# Patient Record
Sex: Male | Born: 1940 | Race: White | Hispanic: No | Marital: Married | Smoking: Never smoker
Health system: Southern US, Community
[De-identification: ages and names within clinical notes are randomized; demographics above are authoritative.]

## PROBLEM LIST (undated history)

## (undated) DIAGNOSIS — E785 Hyperlipidemia, unspecified: Secondary | ICD-10-CM

## (undated) DIAGNOSIS — J189 Pneumonia, unspecified organism: Secondary | ICD-10-CM

## (undated) DIAGNOSIS — R42 Dizziness and giddiness: Secondary | ICD-10-CM

## (undated) DIAGNOSIS — N183 Chronic kidney disease, stage 3 unspecified: Secondary | ICD-10-CM

## (undated) DIAGNOSIS — N400 Enlarged prostate without lower urinary tract symptoms: Secondary | ICD-10-CM

## (undated) DIAGNOSIS — N189 Chronic kidney disease, unspecified: Secondary | ICD-10-CM

## (undated) HISTORY — PX: CATARACT EXTRACTION: SUR2

## (undated) HISTORY — PX: INNER EAR SURGERY: SHX679

## (undated) HISTORY — DX: Benign prostatic hyperplasia without lower urinary tract symptoms: N40.0

## (undated) HISTORY — DX: Hyperlipidemia, unspecified: E78.5

## (undated) HISTORY — PX: KIDNEY STONE SURGERY: SHX686

## (undated) HISTORY — DX: Chronic kidney disease, unspecified: N18.9

## (undated) HISTORY — DX: Chronic kidney disease, stage 3 unspecified: N18.30

## (undated) HISTORY — PX: CLEFT PALATE REPAIR: SUR1165

## (undated) HISTORY — DX: Dizziness and giddiness: R42

## (undated) HISTORY — DX: Pneumonia, unspecified organism: J18.9

---

## 1997-09-07 ENCOUNTER — Ambulatory Visit (HOSPITAL_BASED_OUTPATIENT_CLINIC_OR_DEPARTMENT_OTHER): Admission: RE | Admit: 1997-09-07 | Discharge: 1997-09-07 | Payer: Self-pay | Admitting: *Deleted

## 1997-12-19 ENCOUNTER — Ambulatory Visit (HOSPITAL_COMMUNITY): Admission: RE | Admit: 1997-12-19 | Discharge: 1997-12-19 | Payer: Self-pay | Admitting: Family Medicine

## 2005-07-11 ENCOUNTER — Ambulatory Visit: Payer: Self-pay

## 2006-10-12 ENCOUNTER — Ambulatory Visit: Payer: Self-pay | Admitting: Gastroenterology

## 2006-10-26 ENCOUNTER — Ambulatory Visit: Payer: Self-pay | Admitting: Gastroenterology

## 2009-06-29 ENCOUNTER — Emergency Department (HOSPITAL_COMMUNITY): Admission: EM | Admit: 2009-06-29 | Discharge: 2009-06-30 | Payer: Self-pay | Admitting: Emergency Medicine

## 2009-07-03 ENCOUNTER — Emergency Department (HOSPITAL_COMMUNITY): Admission: EM | Admit: 2009-07-03 | Discharge: 2009-07-04 | Payer: Self-pay | Admitting: Emergency Medicine

## 2009-07-09 ENCOUNTER — Ambulatory Visit (HOSPITAL_COMMUNITY): Admission: RE | Admit: 2009-07-09 | Discharge: 2009-07-09 | Payer: Self-pay | Admitting: Urology

## 2010-03-31 LAB — URINALYSIS, ROUTINE W REFLEX MICROSCOPIC
Bilirubin Urine: NEGATIVE
Glucose, UA: NEGATIVE mg/dL
Glucose, UA: NEGATIVE mg/dL
Ketones, ur: NEGATIVE mg/dL
Specific Gravity, Urine: 1.019 (ref 1.005–1.030)
Specific Gravity, Urine: 1.022 (ref 1.005–1.030)
Urobilinogen, UA: 0.2 mg/dL (ref 0.0–1.0)
pH: 6.5 (ref 5.0–8.0)
pH: 7 (ref 5.0–8.0)

## 2010-03-31 LAB — POCT I-STAT, CHEM 8
BUN: 18 mg/dL (ref 6–23)
Calcium, Ion: 1.14 mmol/L (ref 1.12–1.32)
Calcium, Ion: 1.24 mmol/L (ref 1.12–1.32)
Chloride: 105 mEq/L (ref 96–112)
Creatinine, Ser: 1.4 mg/dL (ref 0.4–1.5)
Glucose, Bld: 104 mg/dL — ABNORMAL HIGH (ref 70–99)
Glucose, Bld: 138 mg/dL — ABNORMAL HIGH (ref 70–99)
HCT: 44 % (ref 39.0–52.0)
HCT: 46 % (ref 39.0–52.0)
Hemoglobin: 15.6 g/dL (ref 13.0–17.0)
Sodium: 138 mEq/L (ref 135–145)
TCO2: 25 mmol/L (ref 0–100)

## 2011-05-07 ENCOUNTER — Ambulatory Visit (INDEPENDENT_AMBULATORY_CARE_PROVIDER_SITE_OTHER): Payer: Medicare Other | Admitting: *Deleted

## 2011-05-07 DIAGNOSIS — I7300 Raynaud's syndrome without gangrene: Secondary | ICD-10-CM

## 2011-05-07 DIAGNOSIS — R230 Cyanosis: Secondary | ICD-10-CM

## 2016-05-26 ENCOUNTER — Other Ambulatory Visit: Payer: Self-pay | Admitting: Urology

## 2016-05-26 DIAGNOSIS — Q6239 Other obstructive defects of renal pelvis and ureter: Secondary | ICD-10-CM

## 2016-06-04 ENCOUNTER — Encounter (HOSPITAL_COMMUNITY)
Admission: RE | Admit: 2016-06-04 | Discharge: 2016-06-04 | Disposition: A | Discharge status: Home / Self Care | Payer: Medicare Other | Source: Ambulatory Visit | Attending: Urology | Admitting: Urology

## 2016-06-04 DIAGNOSIS — Q6239 Other obstructive defects of renal pelvis and ureter: Secondary | ICD-10-CM | POA: Diagnosis not present

## 2016-06-04 MED ORDER — FUROSEMIDE 10 MG/ML IJ SOLN
INTRAMUSCULAR | Status: AC
  Filled 2016-06-04: qty 4

## 2016-06-04 MED ORDER — TECHNETIUM TC 99M MERTIATIDE: 5.0000 | Freq: Once | INTRAVENOUS | Status: DC | PRN

## 2016-06-04 MED ORDER — FUROSEMIDE 10 MG/ML IJ SOLN
41.0000 mg | Freq: Once | INTRAMUSCULAR | Status: AC
  Administered 2016-06-04: 41 mg via INTRAVENOUS

## 2016-11-17 ENCOUNTER — Encounter: Payer: Self-pay | Admitting: Gastroenterology

## 2017-01-12 ENCOUNTER — Telehealth: Payer: Self-pay | Admitting: Gastroenterology

## 2017-01-12 NOTE — Telephone Encounter (Signed)
Left message on machine to call back  

## 2017-01-14 NOTE — Telephone Encounter (Signed)
Dr Alinda DoomsJacobs FYI, please advise on timing of next colon.

## 2017-01-18 NOTE — Telephone Encounter (Signed)
Ok, please let him know that he does not need colon cancer screening of any type for 3 years.  At that point, he should discuss screening options with his PCP or set up appt with me to discuss. He will be 79 at that point and often colon cancer screening is no longer recommended at that age.

## 2017-01-19 NOTE — Telephone Encounter (Signed)
The pt was advised per Dr Christella HartiganJacobs recommendations.  He will follow up as needed or in 3 years.

## 2017-12-07 IMAGING — NM NM RENAL IMAGING FLOW W/ PHARM
2 series · 12 of 12 positions shown · non-contrast
Comparison: None; correlation CT abdomen and pelvis 06/30/2009

CLINICAL DATA: Kidney stones, question obstruction

EXAM:
NUCLEAR MEDICINE RENAL SCAN WITH DIURETIC ADMINISTRATION
TECHNIQUE: Radionuclide angiographic and sequential renal images were obtained
after intravenous injection of radiopharmaceutical. Imaging was
continued during slow intravenous injection of Lasix approximately
15 minutes after the start of the examination.
RADIOPHARMACEUTICALS:  5.0 mCi 7echnetium-DDm MAG3 IV
Pharmaceutical: Lasix 40 mg IV 20 minutes into the exam

[Series 1: renal scan · 4.14mm/px · 6 of 40 frames shown (1 of 2)]
[frame 4/40  full-range]
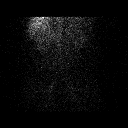
[frame 10/40  full-range]
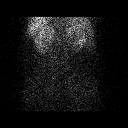
[frame 17/40  full-range]
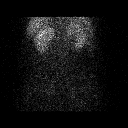
[frame 24/40  full-range]
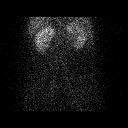
[frame 30/40  full-range]
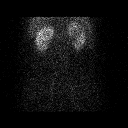
[frame 37/40  full-range]
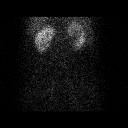

[Series 1: renal scan · 4.14mm/px · 6 of 85 frames shown (2 of 2)]
[frame 8/85]
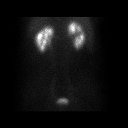
[frame 22/85]
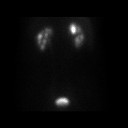
[frame 36/85]
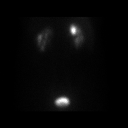
[frame 50/85]
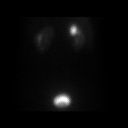
[frame 64/85]
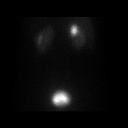
[frame 78/85]
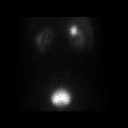

[12 of 12 positions shown; findings below may reference images not displayed]

FINDINGS: Flow:  Prompt symmetric flow to both kidneys.

Left renogram: Normal uptake, concentration and excretion of tracer
by LEFT kidney. Normal time to peak activity of 5.2 minutes with
fall to half maximum activity 16.9 minutes later. No significant
retention of tracer at the conclusion of the exam.

Right renogram: Normal uptake, concentration and excretion of
tracer. Mildly dilated collecting system is seen at the upper pole
of the RIGHT kidney. Retention of tracer within this dilated
collecting system likely accounts for delayed time to peak activity
of 18.2 minutes. Following diuretic administration, normal washout
of tracer from the RIGHT kidney is identified, including from the
dilated collecting system at the upper pole, falling to half maximum
activity 16.9 minutes later.

Differential:

Left kidney = 57 %

Right kidney = 43 %

T1/2 post Lasix :

Left kidney = 16.9 min

Right kidney = 16.9 min
IMPRESSION: Focal mild dilatation of the collecting system of the upper pole of
the RIGHT kidney.

However, normal washout of tracer is seen from both kidneys
following diuretic administration without evidence of urinary tract
obstruction.

Otherwise normal exam.

## 2018-05-03 ENCOUNTER — Other Ambulatory Visit: Payer: Self-pay | Admitting: Internal Medicine

## 2018-05-03 DIAGNOSIS — R7989 Other specified abnormal findings of blood chemistry: Secondary | ICD-10-CM

## 2018-05-06 ENCOUNTER — Ambulatory Visit
Admission: RE | Admit: 2018-05-06 | Discharge: 2018-05-06 | Disposition: A | Discharge status: Home / Self Care | Payer: Medicare Other | Source: Ambulatory Visit | Attending: Internal Medicine | Admitting: Internal Medicine

## 2018-05-06 ENCOUNTER — Other Ambulatory Visit: Payer: Self-pay

## 2018-05-06 DIAGNOSIS — R7989 Other specified abnormal findings of blood chemistry: Secondary | ICD-10-CM

## 2019-11-08 IMAGING — US US RENAL
2 series · 13 of 25 positions shown · non-contrast
Comparison: CT 06/30/2009

CLINICAL DATA: Elevated creatinine

EXAM:
RENAL / URINARY TRACT ULTRASOUND COMPLETE

[Series 1: us renal · 0.25mm/px · 59 acquisitions, 12 frames shown (1 of 2)]
[im 1/59]
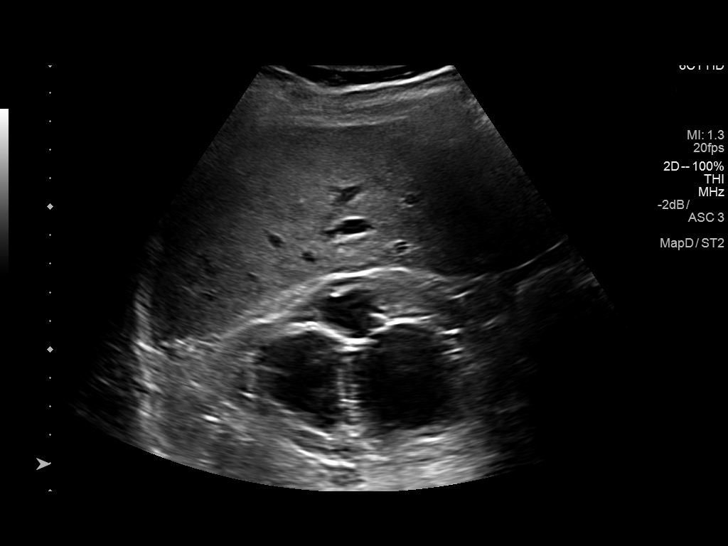
[im 6/59]
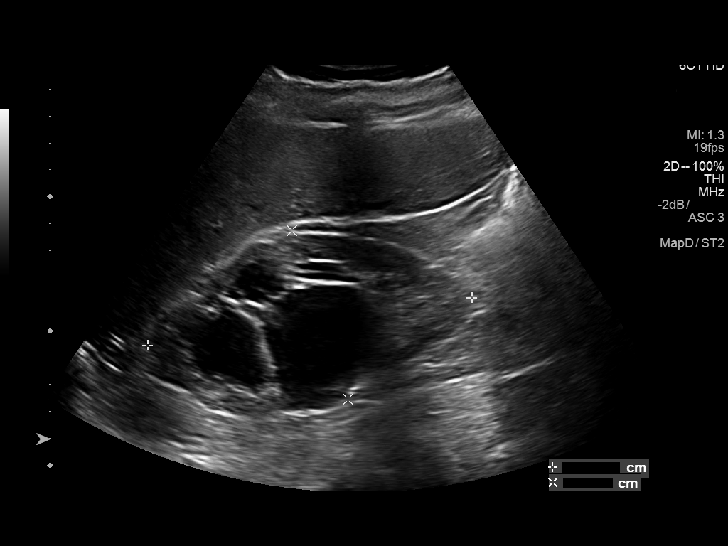
[im 11/59]
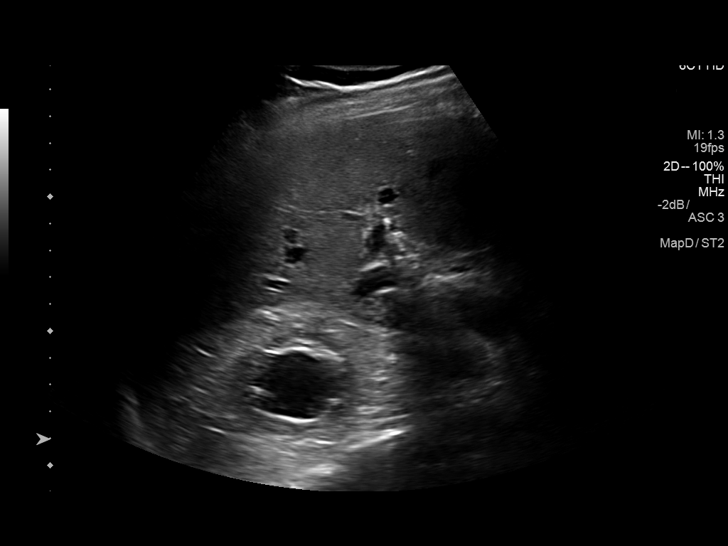
[im 16/59]
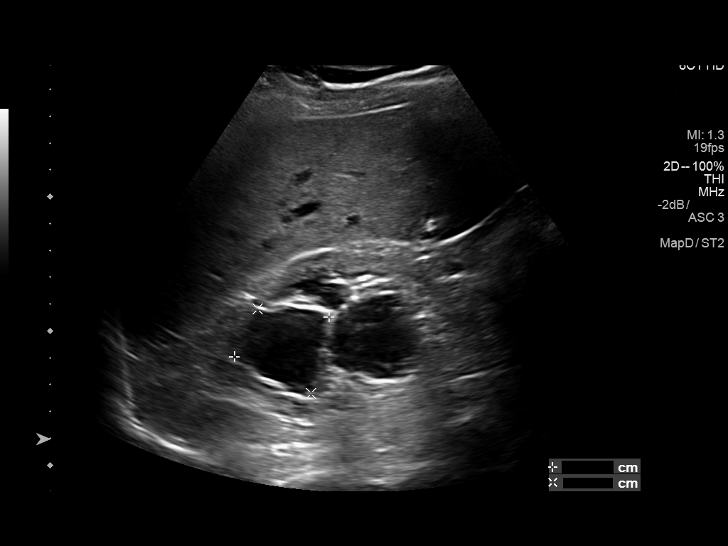
[im 22/59]
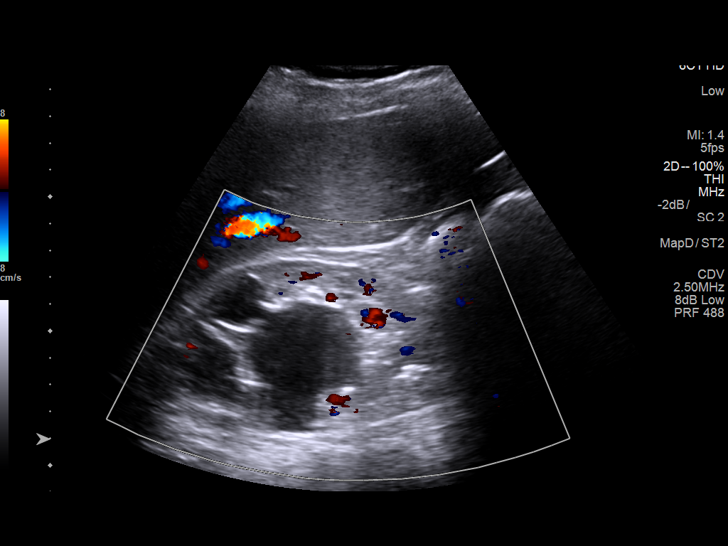
[im 27/59]
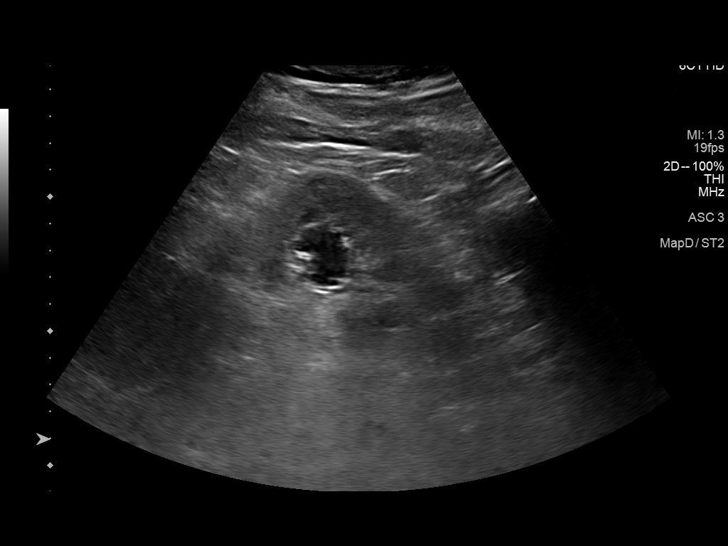
[im 32/59]
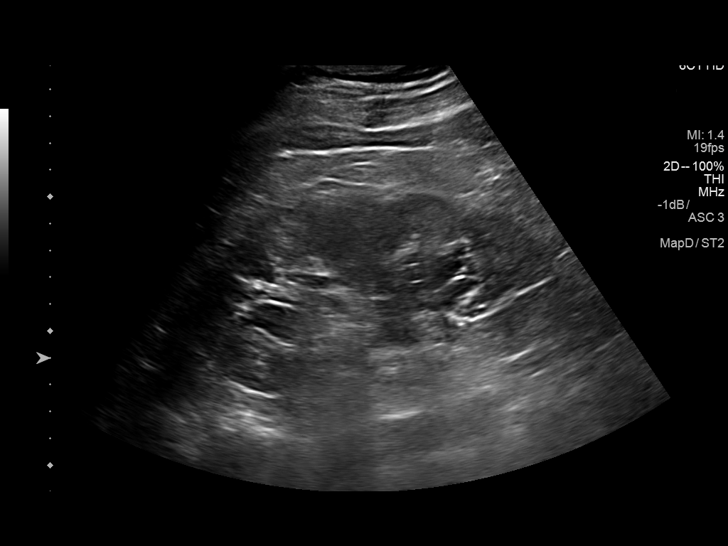
[im 37/59]
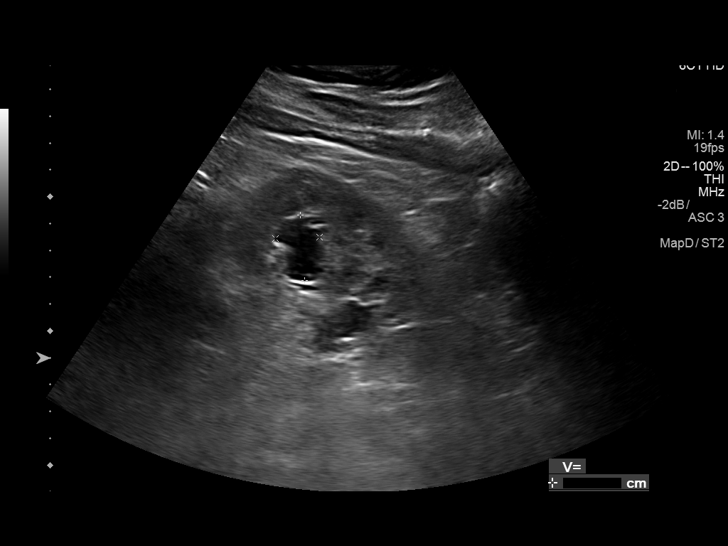
[im 43/59]
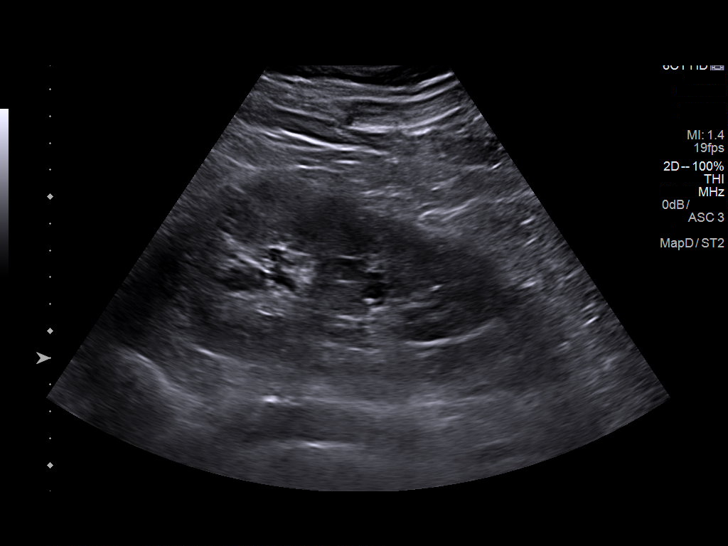
[im 48/59]
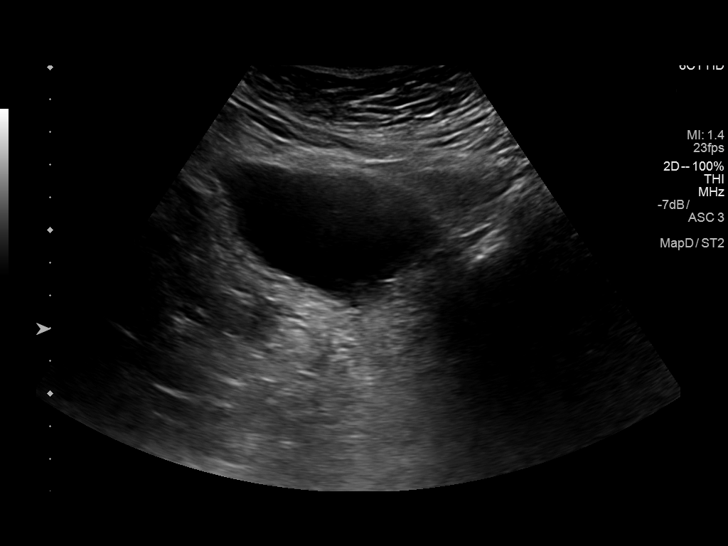
[im 53/59]
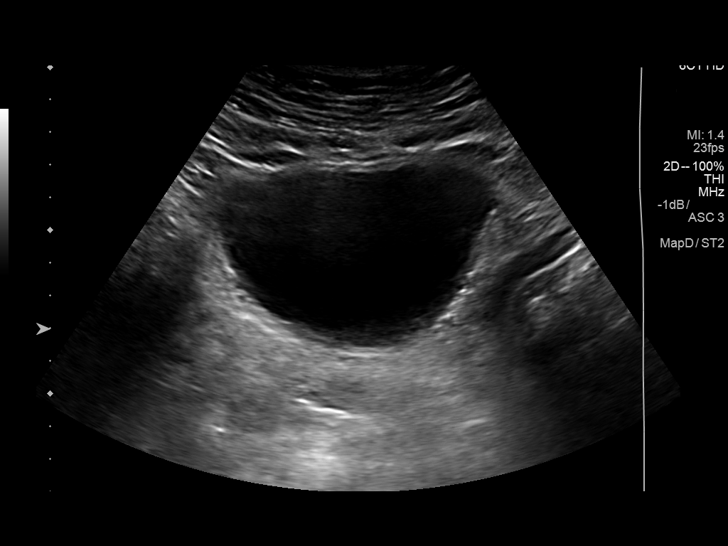
[im 59/59]
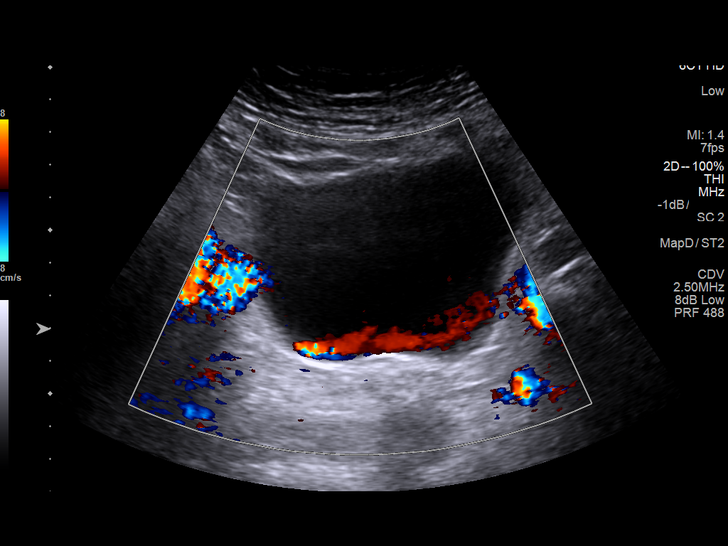

[Series 2: us renal · 0.26mm/px · 1 of 4 slices shown (2 of 2)]
[im 4/4]
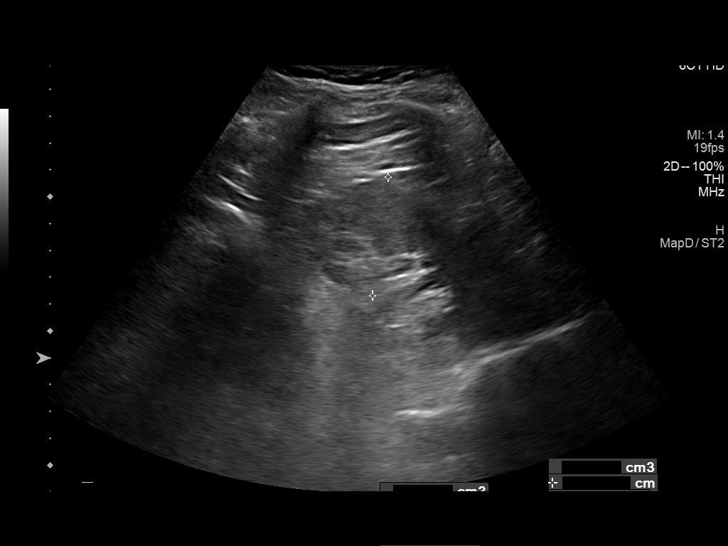

[13 of 25 positions shown; findings below may reference images not displayed]

FINDINGS: Right Kidney:

Renal measurements: 12.2 x 6.6 x 6.4 cm = volume: 271.7 mL. Cortical
echogenicity within normal limits. Multiple, less than 10 central
cystic areas measuring up to 4.8 cm at the midpole.

Left Kidney:

Renal measurements: 12.6 x 6.6 x 4.5 cm = volume: 192.7 mL. Cortical
echogenicity within normal limits. Multiple, less than 10 central
cystic areas with largest area seen at the midpole measuring 2.4 cm.
There may be slight lower pole caliceal dilatation.

Bladder:

Appears normal for degree of bladder distention. Enlarged prostate
gland with mass effect on the posterior bladder.
IMPRESSION: 1. Multiple bilateral right greater than left central cystic areas,
fewer than 10 within each kidney, at least some of which are felt to
represent parapelvic cysts. Anechoic area centrally within the right
kidney either represents central sinus cyst versus dilatation of
right renal pelvis. There may be mild lower pole calyceal dilatation
on the left.
2. Enlarged prostate gland

## 2020-01-10 ENCOUNTER — Other Ambulatory Visit: Payer: Self-pay | Admitting: Urology

## 2020-02-09 ENCOUNTER — Other Ambulatory Visit: Payer: Self-pay | Admitting: Urology

## 2020-02-16 ENCOUNTER — Ambulatory Visit (HOSPITAL_COMMUNITY)
Admission: RE | Admit: 2020-02-16 | Discharge: 2020-02-16 | Disposition: A | Discharge status: Home / Self Care | Payer: Medicare Other | Source: Ambulatory Visit | Attending: Internal Medicine | Admitting: Internal Medicine

## 2020-02-16 ENCOUNTER — Other Ambulatory Visit (HOSPITAL_COMMUNITY): Payer: Self-pay | Admitting: Internal Medicine

## 2020-02-16 ENCOUNTER — Other Ambulatory Visit: Payer: Self-pay

## 2020-02-16 DIAGNOSIS — R42 Dizziness and giddiness: Secondary | ICD-10-CM

## 2020-02-17 ENCOUNTER — Other Ambulatory Visit (HOSPITAL_COMMUNITY): Payer: Self-pay | Admitting: Internal Medicine

## 2020-02-17 DIAGNOSIS — J849 Interstitial pulmonary disease, unspecified: Secondary | ICD-10-CM

## 2020-02-17 DIAGNOSIS — R42 Dizziness and giddiness: Secondary | ICD-10-CM

## 2020-03-12 ENCOUNTER — Ambulatory Visit (HOSPITAL_COMMUNITY): Payer: Medicare Other | Attending: Cardiology

## 2020-03-12 ENCOUNTER — Other Ambulatory Visit: Payer: Self-pay

## 2020-03-12 DIAGNOSIS — J849 Interstitial pulmonary disease, unspecified: Secondary | ICD-10-CM | POA: Diagnosis not present

## 2020-03-12 DIAGNOSIS — E785 Hyperlipidemia, unspecified: Secondary | ICD-10-CM | POA: Diagnosis not present

## 2020-03-12 DIAGNOSIS — I348 Other nonrheumatic mitral valve disorders: Secondary | ICD-10-CM

## 2020-03-12 DIAGNOSIS — I358 Other nonrheumatic aortic valve disorders: Secondary | ICD-10-CM

## 2020-03-12 DIAGNOSIS — I351 Nonrheumatic aortic (valve) insufficiency: Secondary | ICD-10-CM | POA: Diagnosis not present

## 2020-03-12 DIAGNOSIS — R42 Dizziness and giddiness: Secondary | ICD-10-CM | POA: Diagnosis present

## 2020-03-12 DIAGNOSIS — N183 Chronic kidney disease, stage 3 unspecified: Secondary | ICD-10-CM | POA: Insufficient documentation

## 2020-03-12 LAB — ECHOCARDIOGRAM COMPLETE
Area-P 1/2: 2.91 cm2
P 1/2 time: 564 msec
S' Lateral: 2.9 cm

## 2021-01-10 ENCOUNTER — Other Ambulatory Visit: Payer: Self-pay | Admitting: Urology

## 2021-01-15 ENCOUNTER — Other Ambulatory Visit: Payer: Self-pay | Admitting: Urology

## 2021-03-13 ENCOUNTER — Other Ambulatory Visit: Payer: Self-pay | Admitting: Urology

## 2021-05-17 ENCOUNTER — Other Ambulatory Visit: Payer: Self-pay | Admitting: Nephrology

## 2021-05-17 DIAGNOSIS — N400 Enlarged prostate without lower urinary tract symptoms: Secondary | ICD-10-CM

## 2021-05-17 DIAGNOSIS — N1831 Chronic kidney disease, stage 3a: Secondary | ICD-10-CM

## 2021-05-17 DIAGNOSIS — Q614 Renal dysplasia: Secondary | ICD-10-CM

## 2021-05-22 ENCOUNTER — Ambulatory Visit
Admission: RE | Admit: 2021-05-22 | Discharge: 2021-05-22 | Disposition: A | Discharge status: Home / Self Care | Payer: Medicare Other | Source: Ambulatory Visit | Attending: Nephrology | Admitting: Nephrology

## 2021-05-22 DIAGNOSIS — Q614 Renal dysplasia: Secondary | ICD-10-CM

## 2021-05-22 DIAGNOSIS — N1831 Chronic kidney disease, stage 3a: Secondary | ICD-10-CM

## 2021-05-22 DIAGNOSIS — N400 Enlarged prostate without lower urinary tract symptoms: Secondary | ICD-10-CM

## 2022-02-14 ENCOUNTER — Other Ambulatory Visit: Payer: Self-pay | Admitting: Nephrology

## 2022-02-14 DIAGNOSIS — Q614 Renal dysplasia: Secondary | ICD-10-CM

## 2022-02-14 DIAGNOSIS — N1831 Chronic kidney disease, stage 3a: Secondary | ICD-10-CM

## 2022-02-14 DIAGNOSIS — I951 Orthostatic hypotension: Secondary | ICD-10-CM

## 2022-02-14 DIAGNOSIS — N400 Enlarged prostate without lower urinary tract symptoms: Secondary | ICD-10-CM

## 2022-03-05 ENCOUNTER — Ambulatory Visit
Admission: RE | Admit: 2022-03-05 | Discharge: 2022-03-05 | Disposition: A | Discharge status: Home / Self Care | Payer: Medicare Other | Source: Ambulatory Visit | Attending: Nephrology | Admitting: Nephrology

## 2022-03-05 DIAGNOSIS — I951 Orthostatic hypotension: Secondary | ICD-10-CM

## 2022-03-05 DIAGNOSIS — N400 Enlarged prostate without lower urinary tract symptoms: Secondary | ICD-10-CM

## 2022-03-05 DIAGNOSIS — N1831 Chronic kidney disease, stage 3a: Secondary | ICD-10-CM

## 2022-03-05 DIAGNOSIS — Q614 Renal dysplasia: Secondary | ICD-10-CM

## 2022-11-24 IMAGING — US US RENAL
1 series · 13 of 25 positions shown · non-contrast
Comparison: Multiple prior renal ultrasounds, most recent dated
01/05/2020.

CLINICAL DATA: Chronic kidney disease.  Renal cysts.

EXAM:
RENAL / URINARY TRACT ULTRASOUND COMPLETE

[Series 1: us renal · 0.23mm/px · 13 of 52 slices shown]
[im 1/52]
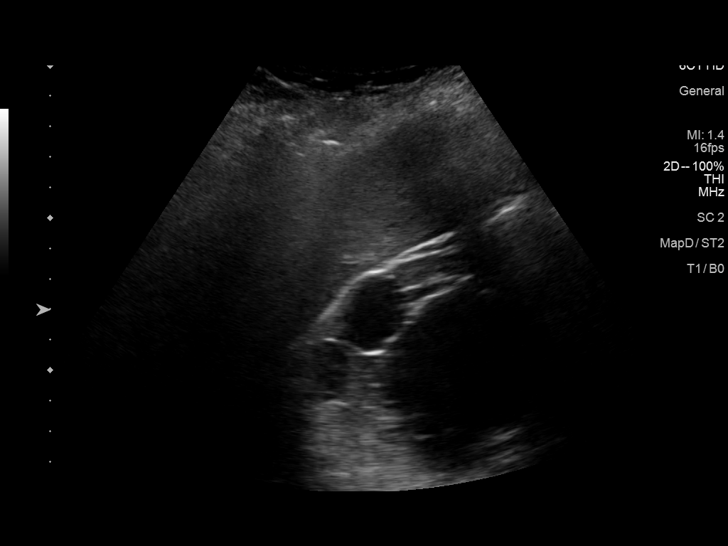
[im 5/52]
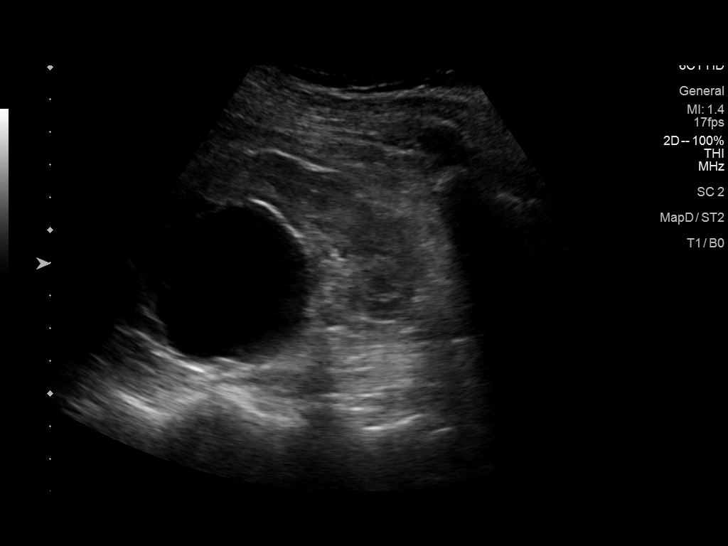
[im 9/52]
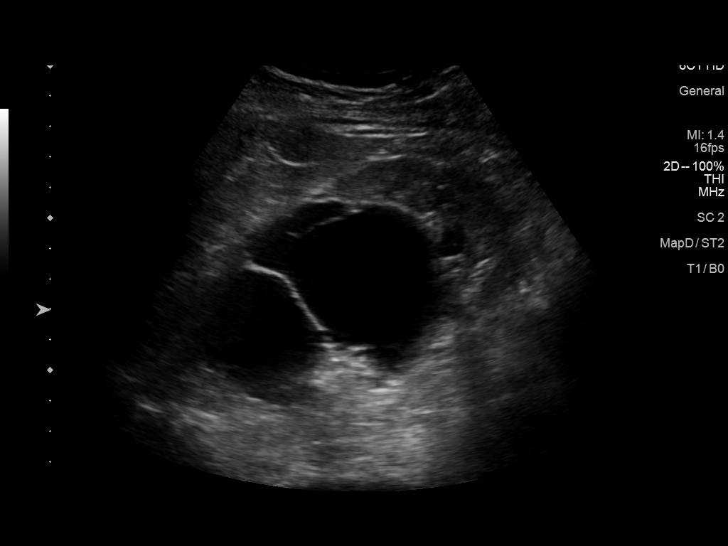
[im 13/52]
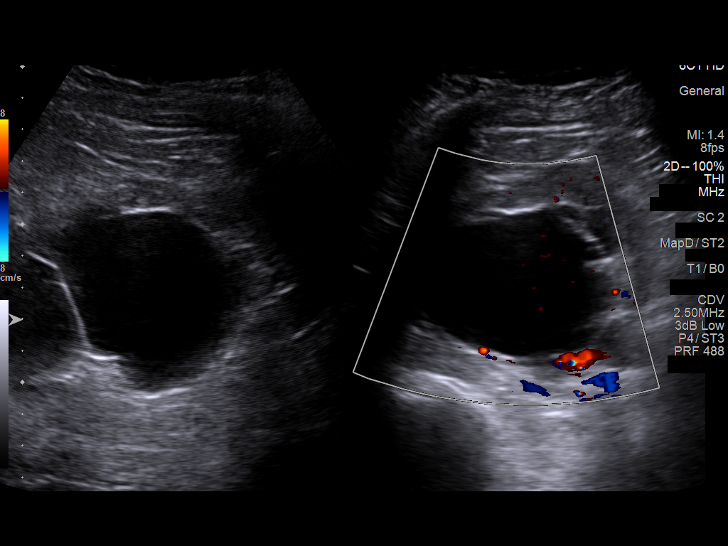
[im 18/52]
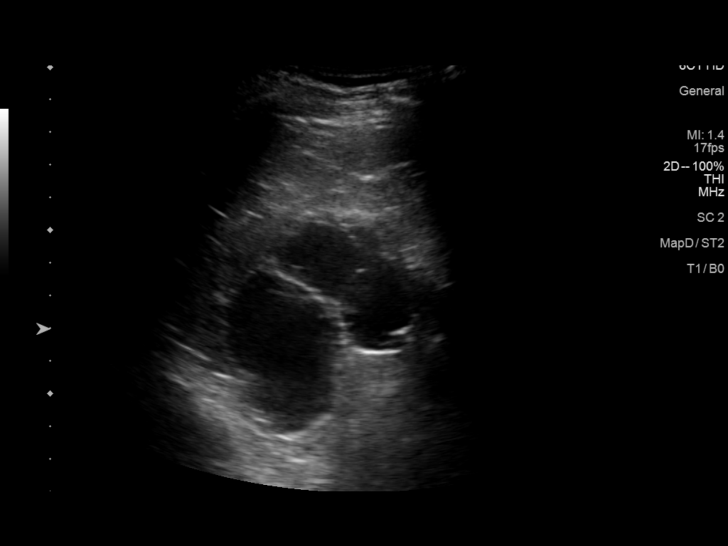
[im 22/52]
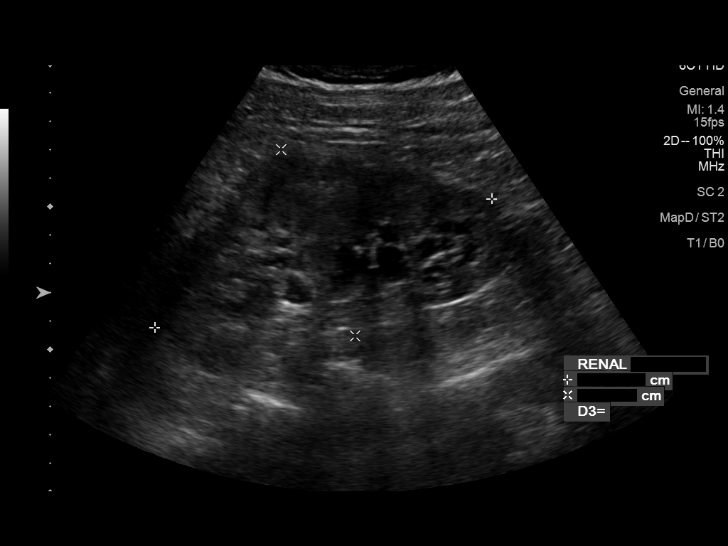
[im 26/52]
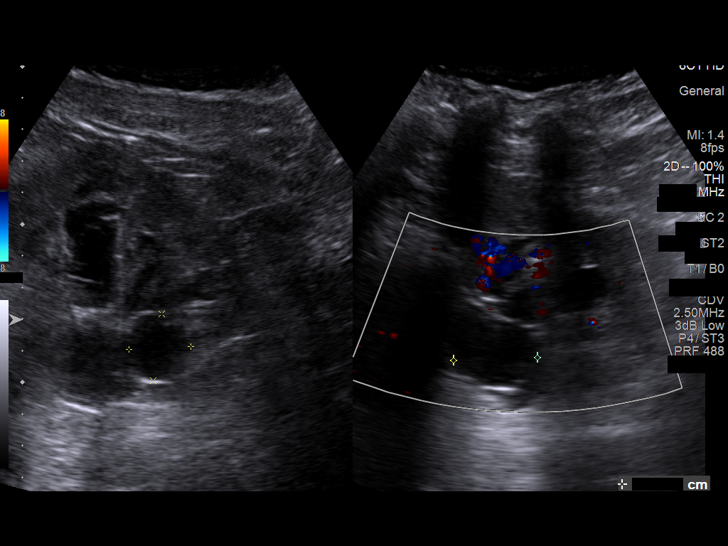
[im 30/52]
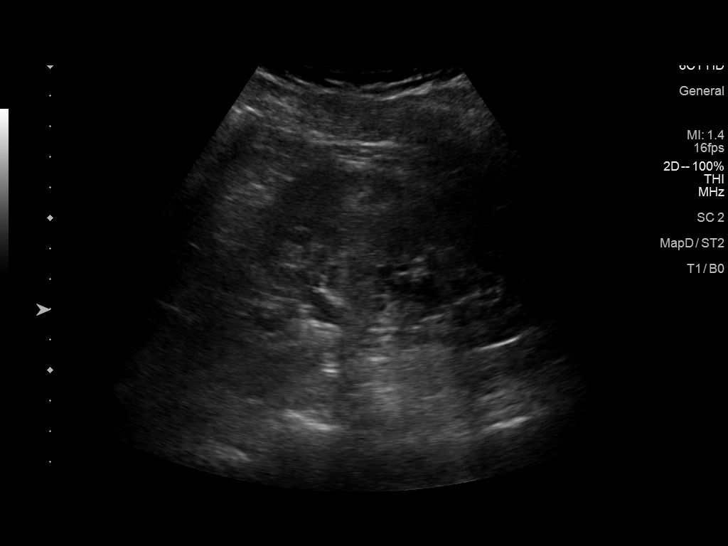
[im 35/52]
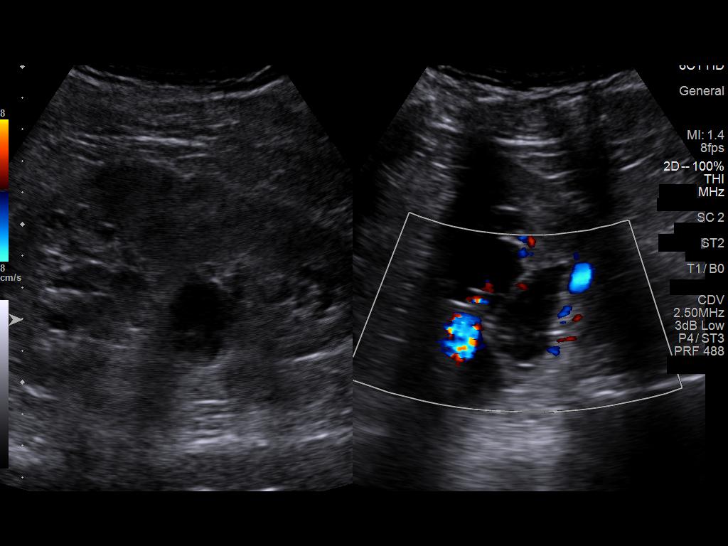
[im 39/52]
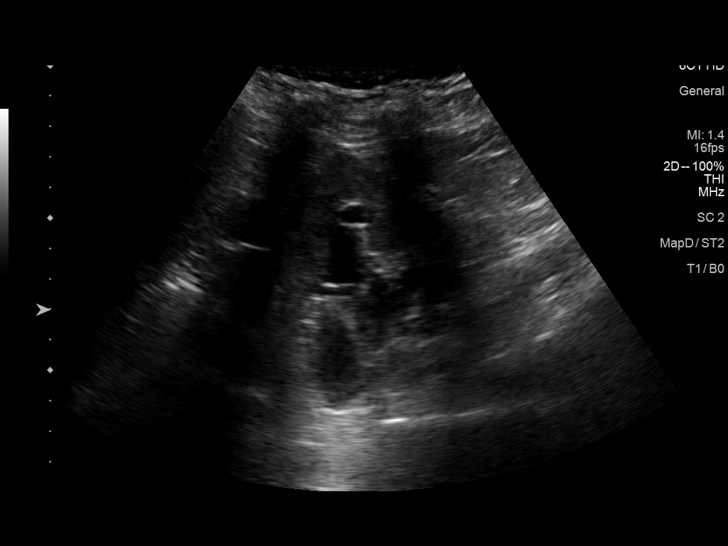
[im 43/52]
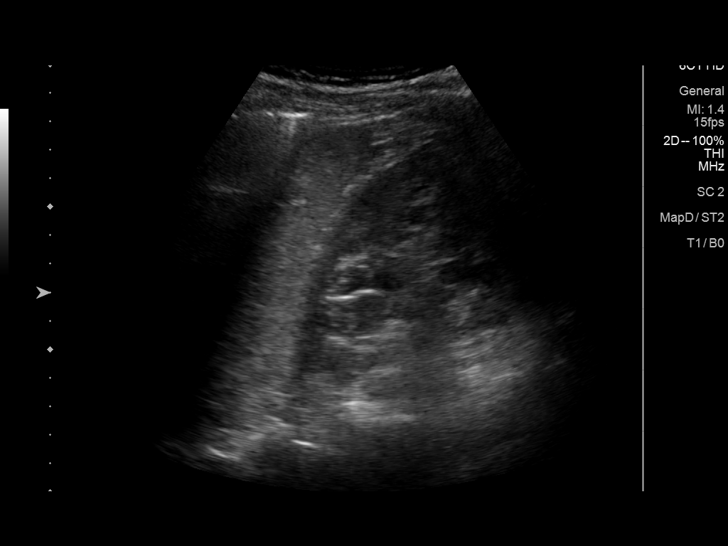
[im 47/52]
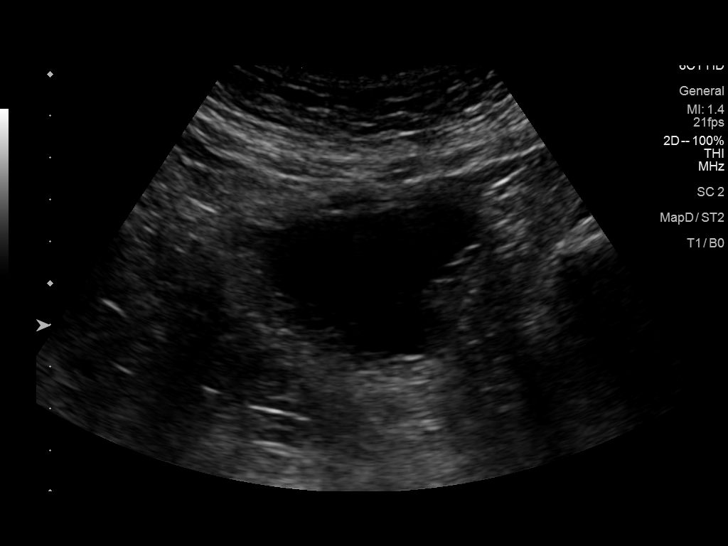
[im 52/52]
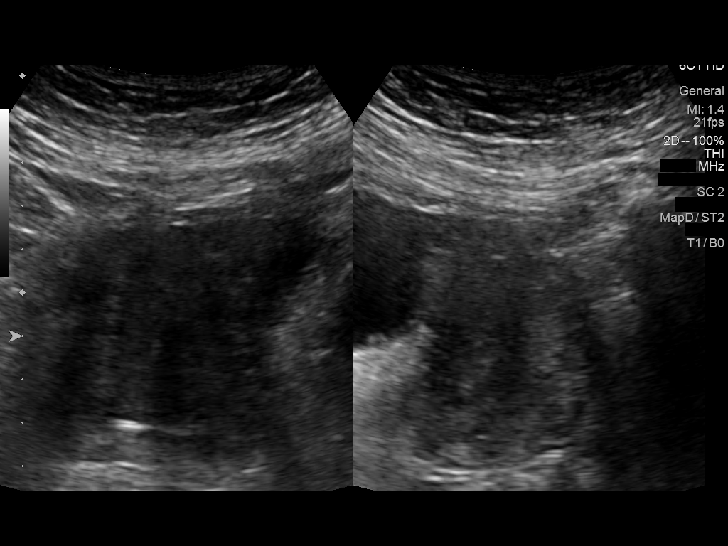

[13 of 25 positions shown; findings below may reference images not displayed]

FINDINGS: Right Kidney:

Renal measurements: 13.7 by 7.0 x 6.7 cm = volume: 346 mL. Normal
parenchymal echogenicity. Renal cortical thinning. Multiple cysts,
largest 3 measured. In the upper pole there is a simple appearing
cyst measuring 5.0 x 3.9 x 4.6 cm. In the medial midpole there is
another prominent cyst measuring 6.5 x 5.6 x 6.0 cm. Lateral to
this, also at the midpole is a smaller cyst measuring 3.1 x 2.8 x
2.5 cm. No convincing solid masses, no stones and no hydronephrosis.

Left Kidney:

Renal measurements: 12.6 x 7.0 x 6.4 cm = volume: 300 mL. Mildly
complicated cyst in the lower pole measuring 2.7 x 2.0 x 2.1 cm.
Similar appearing cyst with a mildly irregular wall and dependent
echogenic foci suggesting debris, and a possible septation, at the
midpole measuring 2.8 x 2.0 x 2.4 cm. There are multiple smaller
cysts. No defined stones and no hydronephrosis.

Bladder:

Appears normal for degree of bladder distention.

Other:

Enlarged prostate, 5.6 x 4.8 x 4.6 cm.
IMPRESSION: 1. Multiple, bilateral renal cysts as detailed above, less than 10,
largest on the right measuring 6.5 cm and largest on the left
measuring 2.7 cm, increased in size from the prior ultrasound. The 2
measured left renal cysts appear mildly complicated, likely Bosniak
category 2, but not fully defined.
2. No acute findings.  No hydronephrosis.
3. Prostate enlargement.

## 2023-07-03 ENCOUNTER — Other Ambulatory Visit: Payer: Self-pay

## 2023-07-03 ENCOUNTER — Ambulatory Visit: Attending: Internal Medicine | Admitting: Speech Pathology

## 2023-07-03 ENCOUNTER — Encounter: Payer: Self-pay | Admitting: Speech Pathology

## 2023-07-03 DIAGNOSIS — R471 Dysarthria and anarthria: Secondary | ICD-10-CM | POA: Insufficient documentation

## 2023-07-03 DIAGNOSIS — R1310 Dysphagia, unspecified: Secondary | ICD-10-CM | POA: Insufficient documentation

## 2023-07-03 NOTE — Therapy (Signed)
 OUTPATIENT SPEECH LANGUAGE PATHOLOGY MOTOR SPEECH EVALUATION   Patient Name: NAHEIM BURGEN MRN: [id number] DOB:1940/06/02, 83 y.o., male Today's Date: 07/03/2023  PCP: Dr. Thor Fling REFERRING PROVIDER: Dr. Thor Fling  END OF SESSION:  End of Session - 07/03/23 1219     Visit Number 1    Number of Visits 9    Date for SLP Re-Evaluation 08/14/23    SLP Start Time 0845    SLP Stop Time  0930    SLP Time Calculation (min) 45 min          History reviewed. No pertinent past medical history. History reviewed. No pertinent surgical history. There are no active problems to display for this patient.   ONSET DATE: 06/30/2023 referral date   REFERRING DIAG: R47.81 (ICD-10-CM) - Slurred speech   THERAPY DIAG:  No diagnosis found.  Rationale for Evaluation and Treatment: Rehabilitation  SUBJECTIVE:   SUBJECTIVE STATEMENT: Patient reports he has noticed a change in his speech for the past 6 to 12 months.  He reports reduced volume and ability to project voice particularly while singing in the car.  He is also noticing challenges with his secretion management characterized by pulling in his throat and drooling.  Also endorsing changes to his swallowing. Pt accompanied by: self  PERTINENT HISTORY: Per 05/11/23 PCP note: Patient presents for work in visit along with his spouse for concerns over progressive worsening tremor in the right upper extremity, especially when he is using the bathroom or holding utensil, writing, but not necessarily at rest. However this is in conjunction with progressive issues of drooling, shuffling gait, holding his elbows up with walking, no falls, but in the setting of chronic dizziness and history of orthostasis as outlined down below.  He denies any fevers, chills, changes to his medications.  Also concerned about his hoarse voice and of course spouses read about Parkinson's disease in online topics.  Given the constellation of symptoms are quite worried and wish and  requested neurology evaluation not for ruling out Parkinson's but also his constellation of symptoms and potential treatments knowing that beta-blockers may actually exacerbate his chronic dizziness.   PAIN:  Are you having pain? No  FALLS: Has patient fallen in last 6 months?  No  LIVING ENVIRONMENT: Lives with: lives with their spouse Lives in: House/apartment  PLOF:  Level of assistance: Independent  Employment: Retired Comptroller   PATIENT GOALS: Improved with speech and swallowing  OBJECTIVE:  Note: Objective measures were completed at Evaluation unless otherwise noted.  COGNITION: Overall cognitive status: Within functional limits for tasks assessed  MOTOR SPEECH: assessed across variety of speech tasks: reading, word repetition, generative discourse sample Overall motor speech: impaired Level of impairment: Word Rate of Speech: Reduced Dysfluencies: none evidenced Phonation: low vocal intensity  Sustained phonation duration: 9 seconds (volume decay)  Conversational loudness average: 68 dB Voice Quality: hoarse, rough, and low vocal intensity  s/z ratio: 1.25 Respiration: thoracic breathing and speaking on residual capacity Word and Phrasal Stress: WFL Resonance: WFL Articulation: Impaired: sentence Diadochokinetic Rate (DDK): irregular rhythm Intelligibility: Intelligible Motor planning: Appears intact Interfering components: anatomical limitations  ORAL MOTOR EXAMINATION: Overall status: WFL Comments: Facial asymmetry, fasciculations  PATIENT REPORTED OUTCOME MEASURES (PROM): Communication Effectiveness Survey        How effective is your speech... Pt Rating  Having a conversation with a family member or friends at home 3  Participating in conversation with strangers in a quiet place  3  Conversing with a familiar person  over the telephone 4  Conversing with a stranger over the telephone 3  Being part of a conversation in a noisy environment  3   Speaking to a friend when you are emotionally upset or angry 4  Having a conversation while traveling in the car 3  Having a conversation with someone at a distance 2                                                                                               Total:  25   1= not at all effective                                                                      4= very effective   SUBJECTIVE DYSPHAGIA REPORTS:  Date of onset: 6-12 months ago Reported symptoms: coughing with liquids and on secretions, globus sensation, weak voice, and challenges with secretion management   Current diet: Dysphagia 3 (mechanical soft) and thin liquids  Co-morbid voice changes: Yes  FACTORS WHICH MAY INCREASE RISK OF ADVERSE EVENT IN PRESENCE OF ASPIRATION:  General health: well appearing  Risk factors: weak cough and GERD or other GI disease    CLINICAL SWALLOW ASSESSMENT:   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 Vocal quality at baseline: hoarse and low vocal intensity Patient directly observed with POs: Yes: thin liquids  Feeding: able to feed self Liquids provided by: cup [facility] Swallow Protocol: Pass Oral phase signs and symptoms: anterior loss of saliva  Pharyngeal phase signs and symptoms: suspected delayed swallow initiation, audible swallow, and complaints of residue, complaints of challenges with swallow                                                                                                                           TREATMENT DATE:  07/03/2023: Education provided on evaluation results and SLP's recommendations. Pt verbalizes agreement with POC, all questions answered to satisfaction. Pt requesting information about how PD can impact speech and swallow. Education provided with supplemental resources given. SLP expressed he will need to follow with neurologist for potential PD dx, this is outside of SLP scope of practice.    PATIENT EDUCATION: Education details:  POC, goals Person educated: Patient Education method: Explanation, Demonstration, and Handouts Education comprehension: verbalized understanding, returned demonstration, and needs further education  HOME EXERCISE PROGRAM:  To be established   GOALS: Goals reviewed with patient? Yes  SHORT TERM GOALS: Target date: 08/14/2023  Pt will demo use of dysarthria strategies and compensations during paragraph level reading tasks with 90% accuracy over 2 sessions Baseline: Goal status: INITIAL  2.  Pt will teach back swallow strategies and compensations with mod-I Baseline:  Goal status: INITIAL  3.  Pt will carryover intentional speech to conversation over 5 minutes, maintaining WNL volume Baseline:  Goal status: INITIAL   LONG TERM GOALS: Target date: 08/28/2023  Pt will complete structured motor speech tasks, meeting target dB in 90% of opportunities and no volume decay Baseline:  Goal status: INITIAL  2.  Pt will maintain WNL volume for 20 minute conversation with rare min-A Baseline:  Goal status: INITIAL  3.  Pt will improve PROM score by 2 pts by d/c Baseline:  Goal status: INITIAL  4.  Pt will report subjective improvement with saliva management with use of trained strategies and compensations  Baseline:  Goal status: INITIAL  ASSESSMENT:  CLINICAL IMPRESSION: Patient is an 83 year old male who is seen today for speech therapy evaluation due to speech and swallow complaints.  Patient presents with a mild dysarthria.  His motor speech is characterized by mildly reduced vocal intensity and occasional imprecise articulation.  He has intact resonance, despite presence of palatal prosthesis.  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  When asked to rate his speech patient states today he feels like he is at 80% of his baseline speech.  He tells SLP that on his worst days his speech is about 50% of his baseline.  His speech is variable though he cannot attribute any pattern to  this variability.  Patient also demonstrating challenges with secretion management.  Patient reports coughing with liquids and on secretions and globus sensation as primary dysphagia complaints.  Patient was able to consume sequential sips of thin liquids without immediate overt signs or symptoms of aspiration.  With increased thin liquids patient did complain of sense of residue and difficulty with swallowing.  SLP provided education on the use of intent for speech and swallowing.  Will plan to address dysphagia via symptom management and if no improvements noted may consider instrumental swallow study to objectively evaluate patient's swallow function.  Will plan to address dysarthria via direct instruction for dysarthria strategies and compensations.  Patient is in agreement with the plan of care and denies questions at the conclusion of today's session.  OBJECTIVE IMPAIRMENTS: Objective impairments include dysarthria and dysphagia. These impairments are limiting patient from effectively communicating at home and in community and safety when swallowing.Factors affecting potential to achieve goals and functional outcome are co-morbidities. Patient will benefit from skilled SLP services to address above impairments and improve overall function.  REHAB POTENTIAL: Good  PLAN:  SLP FREQUENCY: 1-2x/week (pt elects for 1x week d/t schedule)   SLP DURATION: 8 weeks  PLANNED INTERVENTIONS: Aspiration precaution training, Pharyngeal strengthening exercises, Diet toleration management , Cueing hierachy, Internal/external aids, Functional tasks, SLP instruction and feedback, Compensatory strategies, and Patient/family education   Alston Jerry, CCC-SLP 07/03/2023, 12:20 PM

## 2023-07-06 ENCOUNTER — Ambulatory Visit: Admitting: Speech Pathology

## 2023-07-06 ENCOUNTER — Encounter: Payer: Self-pay | Admitting: Speech Pathology

## 2023-07-06 DIAGNOSIS — R471 Dysarthria and anarthria: Secondary | ICD-10-CM | POA: Diagnosis not present

## 2023-07-06 DIAGNOSIS — R1310 Dysphagia, unspecified: Secondary | ICD-10-CM

## 2023-07-06 NOTE — Patient Instructions (Addendum)
 Twice a Voth do this or the online home practice session on the Parkinson voice project website  10 loud Ah! For 10 seconds   10 pitch glides up and down  Count to 20 3x  1 2 3 4 5  6 7 8 9 10  11 12 13 14 15   16 17 18 19 20   Read 20 sentences with intent  Do a naming or conversation exercise  It is normal to feel like you are too loud when you get up to normal volume

## 2023-07-06 NOTE — Therapy (Signed)
 OUTPATIENT SPEECH LANGUAGE PATHOLOGY SPEECH TREATMENT   Patient Name: Carl Nichols MRN: [id number] DOB:[birth year], 83 y.o., male Today's Date: 07/06/2023  PCP: Dr. [name] REFERRING PROVIDER: Dr. [name]  END OF SESSION:  End of Session - 07/06/23 1443     Visit Number 2    Number of Visits 9    Date for SLP Re-Evaluation 08/14/23    SLP Start Time 1445    SLP Stop Time  1530    SLP Time Calculation (min) 45 min    Activity Tolerance Patient tolerated treatment well          History reviewed. No pertinent past medical history. History reviewed. No pertinent surgical history. There are no active problems to display for this patient.   ONSET DATE: 06/30/2023 referral date   REFERRING DIAG: R47.81 (ICD-10-CM) - Slurred speech   THERAPY DIAG:  Dysarthria and anarthria  Dysphagia, unspecified type  Rationale for Evaluation and Treatment: Rehabilitation  SUBJECTIVE:   SUBJECTIVE STATEMENT: Patient reports he has noticed a change in his speech for the past 6 to 12 months.  He reports reduced volume and ability to project voice particularly while singing in the car.  He is also noticing challenges with his secretion management characterized by pulling in his throat and drooling.  Also endorsing changes to his swallowing. Pt accompanied by: self  PERTINENT HISTORY: Per 05/11/23 PCP note: Patient presents for work in visit along with his spouse for concerns over progressive worsening tremor in the right upper extremity, especially when he is using the bathroom or holding utensil, writing, but not necessarily at rest. However this is in conjunction with progressive issues of drooling, shuffling gait, holding his elbows up with walking, no falls, but in the setting of chronic dizziness and history of orthostasis as outlined down below.  He denies any fevers, chills, changes to his medications.  Also concerned about his hoarse voice and of course spouses read about Parkinson's disease in  online topics.  Given the constellation of symptoms are quite worried and wish and requested neurology evaluation not for ruling out Parkinson's but also his constellation of symptoms and potential treatments knowing that beta-blockers may actually exacerbate his chronic dizziness.   PAIN:  Are you having pain? No  FALLS: Has patient fallen in last 6 months?  No  LIVING ENVIRONMENT: Lives with: lives with their spouse Lives in: House/apartment  PLOF:  Level of assistance: Independent  Employment: Retired Comptroller   PATIENT GOALS: Improved with speech and swallowing  OBJECTIVE:  Note: Objective measures were completed at Evaluation unless otherwise noted.  COGNITION: Overall cognitive status: Within functional limits for tasks assessed  MOTOR SPEECH: assessed across variety of speech tasks: reading, word repetition, generative discourse sample Overall motor speech: impaired Level of impairment: Word Rate of Speech: Reduced Dysfluencies: none evidenced Phonation: low vocal intensity  Sustained phonation duration: 9 seconds (volume decay)  Conversational loudness average: 68 dB Voice Quality: hoarse, rough, and low vocal intensity  s/z ratio: 1.25 Respiration: thoracic breathing and speaking on residual capacity Word and Phrasal Stress: WFL Resonance: WFL Articulation: Impaired: sentence Diadochokinetic Rate (DDK): irregular rhythm Intelligibility: Intelligible Motor planning: Appears intact Interfering components: anatomical limitations  ORAL MOTOR EXAMINATION: Overall status: WFL Comments: Facial asymmetry, fasciculations  PATIENT REPORTED OUTCOME MEASURES (PROM): Communication Effectiveness Survey        How effective is your speech... Pt Rating  Having a conversation with a family member or friends at home 3  Participating in conversation with strangers  in a quiet place  3  Conversing with a familiar person over the telephone 4  Conversing with a  stranger over the telephone 3  Being part of a conversation in a noisy environment  3  Speaking to a friend when you are emotionally upset or angry 4  Having a conversation while traveling in the car 3  Having a conversation with someone at a distance 2                                                                                               Total:  25   1= not at all effective                                                                      4= very effective   SUBJECTIVE DYSPHAGIA REPORTS:  Date of onset: 6-12 months ago Reported symptoms: coughing with liquids and on secretions, globus sensation, weak voice, and challenges with secretion management   Current diet: Dysphagia 3 (mechanical soft) and thin liquids  Co-morbid voice changes: Yes  FACTORS WHICH MAY INCREASE RISK OF ADVERSE EVENT IN PRESENCE OF ASPIRATION:  General health: well appearing  Risk factors: weak cough and GERD or other GI disease    CLINICAL SWALLOW ASSESSMENT:   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 Vocal quality at baseline: hoarse and low vocal intensity Patient directly observed with POs: Yes: thin liquids  Feeding: able to feed self Liquids provided by: cup [facility] Swallow Protocol: Pass Oral phase signs and symptoms: anterior loss of saliva  Pharyngeal phase signs and symptoms: suspected delayed swallow initiation, audible swallow, and complaints of residue, complaints of challenges with swallow                                                                                                                           TREATMENT DATE:   07/06/23: Introduced concept of using intent for speech and swallowing. Initiated high intensity voice exercises for HEP - Sustained ah required frequent mod A initially to achieve and maintain 85dB over 10 reps. Pitch glide with occasional modeling and visual (bell curve) cues to average 80dB. Counting to 20 averaged 78dB with usual mod modeling and  verbal cues, oral reading averaged 74dB with occasional min modeling and verbal cues. Structured speech task generating 3 sentence descriptions  of objects, Carl Nichols maintained 72-74dB with verbal and visual cues. Education provided to swallow more frequently to prevent drool and pooling in pharynx. He required cues to ID when he has excess saliva in his mouth affecting his speech and when he had wet voice - ongoing cues throughout session to swallow to avoid clearing his throat. Initially frequent throat clearing, however as session progressed, Carl Nichols was able to identify sensation of wanting to throat clear and avoid this with hard swallows. Reviewed the [employer] website and encouraged Carl Nichols and [name] to complete some online home practice sessions as well as the HEP I provided - see patient instructions. Carl Nichols repeatedly expressed that he felt too loud when speaking at WNL dB (70-74dB) ongoing education that this is normal. [name] also assured Carl Nichols that he was speaking at normal volume.  07/03/2023: Education provided on evaluation results and SLP's recommendations. Pt verbalizes agreement with POC, all questions answered to satisfaction. Pt requesting information about how PD can impact speech and swallow. Education provided with supplemental resources given. SLP expressed he will need to follow with neurologist for potential PD dx, this is outside of SLP scope of practice.    PATIENT EDUCATION: Education details: POC, goals Person educated: Patient Education method: Explanation, Demonstration, and Handouts Education comprehension: verbalized understanding, returned demonstration, and needs further education  HOME EXERCISE PROGRAM: To be established   GOALS: Goals reviewed with patient? Yes  SHORT TERM GOALS: Target date: 08/14/2023  Pt will demo use of dysarthria strategies and compensations during paragraph level reading tasks with 90% accuracy over 2 sessions Baseline: Goal status:  INITIAL  2.  Pt will teach back swallow strategies and compensations with mod-I Baseline:  Goal status: INITIAL  3.  Pt will carryover intentional speech to conversation over 5 minutes, maintaining WNL volume Baseline:  Goal status: INITIAL   LONG TERM GOALS: Target date: 08/28/2023  Pt will complete structured motor speech tasks, meeting target dB in 90% of opportunities and no volume decay Baseline:  Goal status: INITIAL  2.  Pt will maintain WNL volume for 20 minute conversation with rare min-A Baseline:  Goal status: INITIAL  3.  Pt will improve PROM score by 2 pts by d/c Baseline:  Goal status: INITIAL  4.  Pt will report subjective improvement with saliva management with use of trained strategies and compensations  Baseline:  Goal status: INITIAL  ASSESSMENT:  CLINICAL IMPRESSION: Patient is an 83 year old male who is seen today for speech therapy evaluation due to speech and swallow complaints.  Patient presents with a mild dysarthria.  His motor speech is characterized by mildly reduced vocal intensity and occasional imprecise articulation.  He has intact resonance, despite presence of palatal prosthesis.  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  When asked to rate his speech patient states today he feels like he is at 80% of his baseline speech.  He tells SLP that on his worst days his speech is about 50% of his baseline.  His speech is variable though he cannot attribute any pattern to this variability.  Patient also demonstrating challenges with secretion management.  Patient reports coughing with liquids and on secretions and globus sensation as primary dysphagia complaints.  Patient was able to consume sequential sips of thin liquids without immediate overt signs or symptoms of aspiration.  With increased thin liquids patient did complain of sense of residue and difficulty with swallowing.  SLP provided education on the use of intent for speech and swallowing.  Will  plan to address dysphagia via symptom management and if no improvements noted may consider instrumental swallow study to objectively evaluate patient's swallow function.  Will plan to address dysarthria via direct instruction for dysarthria strategies and compensations.  Patient is in agreement with the plan of care and denies questions at the conclusion of today's session.  OBJECTIVE IMPAIRMENTS: Objective impairments include dysarthria and dysphagia. These impairments are limiting patient from effectively communicating at home and in community and safety when swallowing.Factors affecting potential to achieve goals and functional outcome are co-morbidities. Patient will benefit from skilled SLP services to address above impairments and improve overall function.  REHAB POTENTIAL: Good  PLAN:  SLP FREQUENCY: 1-2x/week (pt elects for 1x week d/t schedule)   SLP DURATION: 8 weeks  PLANNED INTERVENTIONS: Aspiration precaution training, Pharyngeal strengthening exercises, Diet toleration management , Cueing hierachy, Internal/external aids, Functional tasks, SLP instruction and feedback, Compensatory strategies, and Patient/family education   Mathis Leita Caldron, CCC-SLP 07/06/2023, 3:55 PM

## 2023-07-15 ENCOUNTER — Ambulatory Visit: Attending: Internal Medicine | Admitting: Speech Pathology

## 2023-07-15 ENCOUNTER — Encounter: Payer: Self-pay | Admitting: Speech Pathology

## 2023-07-15 DIAGNOSIS — R1310 Dysphagia, unspecified: Secondary | ICD-10-CM | POA: Diagnosis present

## 2023-07-15 DIAGNOSIS — R471 Dysarthria and anarthria: Secondary | ICD-10-CM | POA: Diagnosis present

## 2023-07-15 NOTE — Therapy (Signed)
 OUTPATIENT SPEECH LANGUAGE PATHOLOGY SPEECH TREATMENT   Patient Name: Carl Nichols MRN: [id number] DOB:11/11/1940, 83 y.o., male Today's Date: 07/15/2023  PCP: Dr. [name] REFERRING PROVIDER: Dr. [name]  END OF SESSION:  End of Session - 07/15/23 1105     Visit Number 3    Number of Visits 9    Date for SLP Re-Evaluation 08/14/23    SLP Start Time 1104    SLP Stop Time  1145    SLP Time Calculation (min) 41 min    Activity Tolerance Patient tolerated treatment well          History reviewed. No pertinent past medical history. History reviewed. No pertinent surgical history. There are no active problems to display for this patient.   ONSET DATE: 06/30/2023 referral date   REFERRING DIAG: R47.81 (ICD-10-CM) - Slurred speech   THERAPY DIAG:  Dysarthria and anarthria  Dysphagia, unspecified type  Rationale for Evaluation and Treatment: Rehabilitation  SUBJECTIVE:   SUBJECTIVE STATEMENT: Patient reports he has noticed a change in his speech for the past 6 to 12 months.  He reports reduced volume and ability to project voice particularly while singing in the car.  He is also noticing challenges with his secretion management characterized by pulling in his throat and drooling.  Also endorsing changes to his swallowing. Pt accompanied by: self  PERTINENT HISTORY: Per 05/11/23 PCP note: Patient presents for work in visit along with his spouse for concerns over progressive worsening tremor in the right upper extremity, especially when he is using the bathroom or holding utensil, writing, but not necessarily at rest. However this is in conjunction with progressive issues of drooling, shuffling gait, holding his elbows up with walking, no falls, but in the setting of chronic dizziness and history of orthostasis as outlined down below.  He denies any fevers, chills, changes to his medications.  Also concerned about his hoarse voice and of course spouses read about Parkinson's disease in  online topics.  Given the constellation of symptoms are quite worried and wish and requested neurology evaluation not for ruling out Parkinson's but also his constellation of symptoms and potential treatments knowing that beta-blockers may actually exacerbate his chronic dizziness.   PAIN:  Are you having pain? No  FALLS: Has patient fallen in last 6 months?  No  LIVING ENVIRONMENT: Lives with: lives with their spouse Lives in: House/apartment  PLOF:  Level of assistance: Independent  Employment: Retired Comptroller   PATIENT GOALS: Improved with speech and swallowing  OBJECTIVE:  Note: Objective measures were completed at Evaluation unless otherwise noted.  COGNITION: Overall cognitive status: Within functional limits for tasks assessed  MOTOR SPEECH: assessed across variety of speech tasks: reading, word repetition, generative discourse sample Overall motor speech: impaired Level of impairment: Word Rate of Speech: Reduced Dysfluencies: none evidenced Phonation: low vocal intensity  Sustained phonation duration: 9 seconds (volume decay)  Conversational loudness average: 68 dB Voice Quality: hoarse, rough, and low vocal intensity  s/z ratio: 1.25 Respiration: thoracic breathing and speaking on residual capacity Word and Phrasal Stress: WFL Resonance: WFL Articulation: Impaired: sentence Diadochokinetic Rate (DDK): irregular rhythm Intelligibility: Intelligible Motor planning: Appears intact Interfering components: anatomical limitations  ORAL MOTOR EXAMINATION: Overall status: WFL Comments: Facial asymmetry, fasciculations  PATIENT REPORTED OUTCOME MEASURES (PROM): Communication Effectiveness Survey        How effective is your speech... Pt Rating  Having a conversation with a family member or friends at home 3  Participating in conversation with strangers  in a quiet place  3  Conversing with a familiar person over the telephone 4  Conversing with a  stranger over the telephone 3  Being part of a conversation in a noisy environment  3  Speaking to a friend when you are emotionally upset or angry 4  Having a conversation while traveling in the car 3  Having a conversation with someone at a distance 2                                                                                               Total:  25   1= not at all effective                                                                      4= very effective   SUBJECTIVE DYSPHAGIA REPORTS:  Date of onset: 6-12 months ago Reported symptoms: coughing with liquids and on secretions, globus sensation, weak voice, and challenges with secretion management   Current diet: Dysphagia 3 (mechanical soft) and thin liquids  Co-morbid voice changes: Yes  FACTORS WHICH MAY INCREASE RISK OF ADVERSE EVENT IN PRESENCE OF ASPIRATION:  General health: well appearing  Risk factors: weak cough and GERD or other GI disease    CLINICAL SWALLOW ASSESSMENT:   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 Vocal quality at baseline: hoarse and low vocal intensity Patient directly observed with POs: Yes: thin liquids  Feeding: able to feed self Liquids provided by: cup [facility] Swallow Protocol: Pass Oral phase signs and symptoms: anterior loss of saliva  Pharyngeal phase signs and symptoms: suspected delayed swallow initiation, audible swallow, and complaints of residue, complaints of challenges with swallow                                                                                                                           TREATMENT DATE:   07/15/23: [name] has completed HEP daily - spouse endorses this. Today, he self corrected low volume speech with occasional min A upon entering room. Targeted volume and ongoing training in HEP of high intensity vocal exercises - resonant warm up, sustained ah and glides averaged 82dB with occasional min A to make exercises smooth and connected. Oral  reading at sentence level averaged 74dB with occasional verbal cues and modeling. Conversation exercises required usual semantic cues and  modeling to maintain 72-74dB. Intermittent cue to swallow saliva. He did demonstrate throat clear alternatives/throat clear suppression 2x this session with supervision cues. In simple conversation, [name] used intentional speech to average 70dB with occasional min A over 5 minutes. Water sips throughout session with training/cues to swallow with intent  07/06/23: Introduced concept of using intent for speech and swallowing. Initiated high intensity voice exercises for HEP - Sustained ah required frequent mod A initially to achieve and maintain 85dB over 10 reps. Pitch glide with occasional modeling and visual (bell curve) cues to average 80dB. Counting to 20 averaged 78dB with usual mod modeling and verbal cues, oral reading averaged 74dB with occasional min modeling and verbal cues. Structured speech task generating 3 sentence descriptions of objects, [name] maintained 72-74dB with verbal and visual cues. Education provided to swallow more frequently to prevent drool and pooling in pharynx. He required cues to ID when he has excess saliva in his mouth affecting his speech and when he had wet voice - ongoing cues throughout session to swallow to avoid clearing his throat. Initially frequent throat clearing, however as session progressed, [name] was able to identify sensation of wanting to throat clear and avoid this with hard swallows. Reviewed the Parkinson [employer] website and encouraged [name] and [name] to complete some online home practice sessions as well as the HEP I provided - see patient instructions. [name] repeatedly expressed that he felt too loud when speaking at WNL dB (70-74dB) ongoing education that this is normal. [name] also assured [name] that he was speaking at normal volume.  07/03/2023: Education provided on evaluation results and SLP's recommendations. Pt  verbalizes agreement with POC, all questions answered to satisfaction. Pt requesting information about how PD can impact speech and swallow. Education provided with supplemental resources given. SLP expressed he will need to follow with neurologist for potential PD dx, this is outside of SLP scope of practice.    PATIENT EDUCATION: Education details: POC, goals Person educated: Patient Education method: Explanation, Demonstration, and Handouts Education comprehension: verbalized understanding, returned demonstration, and needs further education  HOME EXERCISE PROGRAM: To be established   GOALS: Goals reviewed with patient? Yes  SHORT TERM GOALS: Target date: 08/14/2023  Pt will demo use of dysarthria strategies and compensations during paragraph level reading tasks with 90% accuracy over 2 sessions Baseline: Goal status: ONGOING  2.  Pt will teach back swallow strategies and compensations with mod-I Baseline:  Goal status:ONGOING  3.  Pt will carryover intentional speech to conversation over 5 minutes, maintaining WNL volume Baseline:  Goal status: ONGOING   LONG TERM GOALS: Target date: 08/28/2023  Pt will complete structured motor speech tasks, meeting target dB in 90% of opportunities and no volume decay Baseline:  Goal status: ONGOING  2.  Pt will maintain WNL volume for 20 minute conversation with rare min-A Baseline:  Goal status: ONGOING  3.  Pt will improve PROM score by 2 pts by d/c Baseline:  Goal status: ONGOING  4.  Pt will report subjective improvement with saliva management with use of trained strategies and compensations  Baseline:  Goal status: ONGOING  ASSESSMENT:  CLINICAL IMPRESSION: Patient is an 83 year old male who is seen today for speech therapy evaluation due to speech and swallow complaints.  Patient presents with a mild dysarthria.  His motor speech is characterized by mildly reduced vocal intensity and occasional imprecise articulation.  He  has intact resonance, despite presence of palatal prosthesis.  Patient does have a history of cleft  palate.  When asked to rate his speech patient states today he feels like he is at 80% of his baseline speech.  He tells SLP that on his worst days his speech is about 50% of his baseline.  His speech is variable though he cannot attribute any pattern to this variability.  Patient also demonstrating challenges with secretion management.  Patient reports coughing with liquids and on secretions and globus sensation as primary dysphagia complaints.  Patient was able to consume sequential sips of thin liquids without immediate overt signs or symptoms of aspiration.  With increased thin liquids patient did complain of sense of residue and difficulty with swallowing.  SLP provided education on the use of intent for speech and swallowing.  Will plan to address dysphagia via symptom management and if no improvements noted may consider instrumental swallow study to objectively evaluate patient's swallow function.  Will plan to address dysarthria via direct instruction for dysarthria strategies and compensations.  Patient is in agreement with the plan of care and denies questions at the conclusion of today's session.  OBJECTIVE IMPAIRMENTS: Objective impairments include dysarthria and dysphagia. These impairments are limiting patient from effectively communicating at home and in community and safety when swallowing.Factors affecting potential to achieve goals and functional outcome are co-morbidities. Patient will benefit from skilled SLP services to address above impairments and improve overall function.  REHAB POTENTIAL: Good  PLAN:  SLP FREQUENCY: 1-2x/week (pt elects for 1x week d/t schedule)   SLP DURATION: 8 weeks  PLANNED INTERVENTIONS: Aspiration precaution training, Pharyngeal strengthening exercises, Diet toleration management , Cueing hierachy, Internal/external aids, Functional tasks, SLP instruction  and feedback, Compensatory strategies, and Patient/family education   Mathis Leita Caldron, CCC-SLP 07/15/2023, 11:58 AM

## 2023-07-19 NOTE — Progress Notes (Unsigned)
 [facility]  PATIENT: Carl Nichols DOB: [birth year]  REFERRING DOCTOR OR PCP:  Dr. [name] SOURCE: Patient, notes from primary care, imaging reports, MRI images have been requested.  _________________________________   HISTORICAL  CHIEF COMPLAINT:  Chief Complaint  Patient presents with   New Patient (Initial Visit)    Pt in room 11. [name] wife in room.Paper referral. RUE tremor, gait abnormality,dizziness, voice changes. Pt reports dizziness/lightheaded comes and goes, not daily. Wife states pt walk stiff like. No syncope     HISTORY OF PRESENT ILLNESS:  I had the pleasure of seeing your patient, Carl Nichols today, at [facility] for neurologic consultation regarding his tremor and gait disturbance that have progressed over the last 6 months.  He is an 83 yo man who has had right arm tremors and difficulty with gait.   Tremor is usually mild but worse when he has a BM. He feels symptoms started about 6 months ago and has mildly worsened.   His wife notes his gait has slowed over the past 6 months.  He has no falls. She is not sure if stride has changed.   He has had drooling the past 6 months.   His voice is softer and he is sdoing Speech therapy.  He does not note change in swallowing.   He does not note muscle stiffness.   He feels his thought process is slower.and memory is worse.   No definite bradykinesia.   He has much less physical endurance the past 6 months.  He has had some lightheadedness and sometimes vertigo.   This is worse right after standing or bending over or looking up.   He has no syncope.  He has had at least 2 active dreams, 1 kicking the wall and another where he hit his wife while sleeping.  He has lost about 10-15 pounds over the past year.      He saw Dr. [name] 05/11/2023 for dizziness and hearing loss  He has BPH (on finasteride and alfuzosin - no change x years) and HLD.   He has chronic renal failure  REVIEW OF  SYSTEMS: Constitutional: No fevers, chills, sweats, or change in appetite Eyes: No visual changes, double vision, eye pain Ear, nose and throat: Has stable hearing loss.  No ear pain, nasal congestion, sore throat Cardiovascular: No chest pain, palpitations Respiratory:  No shortness of breath at rest or with exertion.   No wheezes GastrointestinaI: No nausea, vomiting, diarrhea, abdominal pain, fecal incontinence Genitourinary: He has BPH with some urinary urgency at times  musculoskeletal:  No neck pain, back pain Integumentary: No rash, pruritus, skin lesions Neurological: as above Psychiatric: No depression at this time.  No anxiety Endocrine: No palpitations, diaphoresis, change in appetite, change in weigh or increased thirst Hematologic/Lymphatic:  No anemia, purpura, petechiae. Allergic/Immunologic: No itchy/runny eyes, nasal congestion, recent allergic reactions, rashes  ALLERGIES: Allergies  Allergen Reactions   Rosuvastatin Other (See Comments)   Penicillin G Other (See Comments)    Reaction unknown    HOME MEDICATIONS:  Current Outpatient Medications:    acetaminophen (TYLENOL) 500 MG tablet, Take 500 mg by mouth every 4 (four) hours as needed., Disp: , Rfl:    alfuzosin (UROXATRAL) 10 MG 24 hr tablet, TAKE 1 TABLET BY MOUTH TWICE DAILY, Disp: 180 tablet, Rfl: 3   carboxymethylcellul-glycerin (OPTIVE) 0.5-0.9 % ophthalmic solution, Place 1 drop into both eyes as needed., Disp: , Rfl:    chlorhexidine (PERIDEX) 0.12 % solution, Use as  directed 5 mLs in the mouth or throat 2 (two) times daily. 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 No pertinent family history.  SOCIAL HISTORY: Social History   Socioeconomic History   Marital status: Married    Spouse name: Not on file   Number of children: Not on file   Years of education: Not on file   Highest education level: Not on file  Occupational History   Not on file  Tobacco Use   Smoking status: Never   Smokeless tobacco: Never  Vaping Use   Vaping status: Never Used  Substance and Sexual Activity   Alcohol use: Not Currently   Drug use: Never   Sexual activity: Not on file  Other Topics Concern   Not on file  Social History Narrative   Not on file   Social Drivers of Health   Financial Resource Strain: Not on file  Food Insecurity: Low Risk  (07/09/2023)   Received from [facility]   Hunger Vital Sign    Within the past 12 months, you worried that your food would run out before you got money to buy more: Never true    Within the past 12 months, the food you bought just didn't last and you didn't have money to get more. : Never true  Transportation Needs: No Transportation Needs (07/09/2023)   Received from [facility]    In the past 12 months, has lack of reliable transportation kept you from medical appointments, meetings, work or from getting things needed for daily living? : No  Physical Activity: Not on file  Stress: Not on file  Social Connections: Not on file  Intimate Partner Violence: Not on file       PHYSICAL EXAM  Vitals:   07/20/23 1114  BP: (!) 154/69  Pulse: 73  Weight: 158 lb 8 oz (71.9 kg)  Height: 5' 11 (1.803 m)    Body mass index is 22.11 kg/m.    Orthostatic VS for the  past 24 hrs:  BP- Lying Pulse- Lying BP- Standing at 0 minutes Pulse- Standing at 0 minutes  07/20/23 1120 179/68 59 110/68 83       General: The patient is well-developed and well-nourished and in no acute distress  HEENT: He has cleft lip palate surgery scar.  Sclera are anicteric.    Neck: No carotid bruits are noted.  The neck is nontender.  Cardiovascular: The heart has a regular rate and rhythm with a normal S1 and S2. There were no murmurs, gallops or rubs.    Skin: Extremities are without rash or  edema.  Musculoskeletal:  Back is nontender  Neurologic Exam  Mental status: The patient is alert and oriented x 3 at the time of the examination. The patient has apparent normal recent and remote memory, with an apparently normal attention span and concentration ability.   Speech is normal.  Cranial nerves: Extraocular movements are full. Pupils are equal, round, and reactive to light and accomodation.  Visual fields are full. There is good facial sensation to soft touch bilaterally.Facial strength is normal.  Trapezius and sternocleidomastoid strength is normal. No dysarthria is noted.  The tongue is midline, and the patient has symmetric elevation of the soft palate. No obvious hearing deficits are noted.  Motor: He has a 4 Hz tremor in the right arm with some fluctuations.  Muscle bulk is normal.  Muscle tone is normal. Strength is  5 / 5 in all 4 extremities.   Sensory: Sensory testing is intact to pinprick, soft touch and vibration sensation in the arms and the proximal legs but nearly absent vibration sensation at the toes.  Coordination: Cerebellar testing reveals good finger-nose-finger and heel-to-shin bilaterally.  Gait and station: Station is normal.  Gait appeared normal for age.  He can turn 180 degrees in 4 steps which is likely normal for age.  He cannot do a tandem walk.  He did not have any significant retropulsion.  Romberg was negative..   Reflexes: Deep tendon  reflexes are symmetric and normal in arms, 1 at the knees and absent at the ankles..   Plantar responses are flexor.    DIAGNOSTIC DATA (LABS, IMAGING, TESTING) - I reviewed patient records, labs, notes, testing and imaging myself where available.  Lab Results  Component Value Date   HGB 15.6 07/03/2009   HCT 46.0 07/03/2009      Component Value Date/Time   NA 138 07/03/2009 2233   K 3.6 07/03/2009 2233   CL 105 07/03/2009 2233   GLUCOSE 138 (H) 07/03/2009 2233   BUN 18 07/03/2009 2233   CREATININE 1.4 07/03/2009 2233        ASSESSMENT AND PLAN  Gait disturbance - Plan: Vitamin B12, Multiple Myeloma Panel (SPEP&IFE w/QIG), MR BRAIN WO CONTRAST  Numbness - Plan: Vitamin B12, Multiple Myeloma Panel (SPEP&IFE w/QIG)  Orthostatic lightheadedness   In summary, Mr. Carico is an 83 year old man who has had progressive worsening of gait and a right arm tremor over the last 6 months.  My biggest concern is that he might have an early presentation of Parkinson's disease.  He does not fulfill enough criteria at this point.  Specifically, there was normal muscle tone and he did not have retropulsion of his gait.  He has had 2 episodes of active dreams that could be very minimal REM behavior disorder.  Orthostatic hypotension can be seen with Parkinson's and also with related disorders such as MSA-P (Shy-Drager syndrome).  I will hold off on any specific treatment at this time but he will return in 6 months for further he evaluation and if symptoms worsen we will consider empiric therapy.  We will also check MRI of the brain to evaluate for central etiology of the gait disturbance.  Additionally vitamin B12 and SPEP/IFE will be checked due to the numbness in the toes  Additionally, he has orthostatic hypotension.  This has not led to syncope.  We will keep an eye on this.  Consider pyridostigmine or fludrocortisone if this becomes more of an issue.  I will see him back in 6 months.  He is  advised to call us  sooner if he has significant worsening of symptoms.  Thank you for asking me to see this patient.  Please let me know if I can be of further assistance with him or other patients in the future.   Zanylah Hardie A. Vear, MD, [facility] 07/20/2023, 1:04 PM Certified in Neurology, Clinical Neurophysiology, Sleep Medicine and Neuroimaging  [facility] [address] [place], [place] [address] [phone]

## 2023-07-20 ENCOUNTER — Ambulatory Visit: Admitting: Neurology

## 2023-07-20 ENCOUNTER — Telehealth: Payer: Self-pay | Admitting: Neurology

## 2023-07-20 ENCOUNTER — Encounter: Payer: Self-pay | Admitting: Neurology

## 2023-07-20 VITALS — BP 154/69 | HR 73 | Ht 71.0 in | Wt 158.5 lb

## 2023-07-20 DIAGNOSIS — R269 Unspecified abnormalities of gait and mobility: Secondary | ICD-10-CM

## 2023-07-20 DIAGNOSIS — R42 Dizziness and giddiness: Secondary | ICD-10-CM

## 2023-07-20 DIAGNOSIS — R200 Anesthesia of skin: Secondary | ICD-10-CM | POA: Diagnosis not present

## 2023-07-20 NOTE — Telephone Encounter (Signed)
 no auth required sent to [employer] [phone]

## 2023-07-22 ENCOUNTER — Ambulatory Visit: Payer: Self-pay | Admitting: Neurology

## 2023-07-22 LAB — MULTIPLE MYELOMA PANEL, SERUM
Albumin SerPl Elph-Mcnc: 3.9 g/dL (ref 2.9–4.4)
Albumin/Glob SerPl: 1.8 — AB (ref 0.7–1.7)
Alpha 1: 0.2 g/dL (ref 0.0–0.4)
Alpha2 Glob SerPl Elph-Mcnc: 0.6 g/dL (ref 0.4–1.0)
B-Globulin SerPl Elph-Mcnc: 0.8 g/dL (ref 0.7–1.3)
Gamma Glob SerPl Elph-Mcnc: 0.6 g/dL (ref 0.4–1.8)
Globulin, Total: 2.2 g/dL (ref 2.2–3.9)
IgA/Immunoglobulin A, Serum: 95 mg/dL (ref 61–437)
IgG (Immunoglobin G), Serum: 651 mg/dL (ref 603–1613)
IgM (Immunoglobulin M), Srm: 51 mg/dL (ref 15–143)
Total Protein: 6.1 g/dL (ref 6.0–8.5)

## 2023-07-22 LAB — VITAMIN B12: Vitamin B-12: 937 pg/mL (ref 232–1245)

## 2023-07-23 ENCOUNTER — Ambulatory Visit: Admitting: Speech Pathology

## 2023-07-23 DIAGNOSIS — R471 Dysarthria and anarthria: Secondary | ICD-10-CM

## 2023-07-23 DIAGNOSIS — R1310 Dysphagia, unspecified: Secondary | ICD-10-CM

## 2023-07-23 NOTE — Therapy (Signed)
 OUTPATIENT SPEECH LANGUAGE PATHOLOGY SPEECH TREATMENT   Patient Name: Carl Nichols MRN: [id number] DOB:09/23/1940, 83 y.o., male Today's Date: 07/23/2023  PCP: Dr. [name] REFERRING PROVIDER: Dr. 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   ONSET DATE: 06/30/2023 referral date   REFERRING DIAG: R47.81 (ICD-10-CM) - Slurred speech   THERAPY DIAG:  Dysarthria and anarthria  Dysphagia, unspecified type  Rationale for Evaluation and Treatment: Rehabilitation  SUBJECTIVE:   SUBJECTIVE STATEMENT: I dont have to ask him to repeat very often, before it was all of the time. . Pt accompanied by: self  PERTINENT HISTORY: Per 05/11/23 PCP note: Patient presents for work in visit along with his spouse for concerns over progressive worsening tremor in the right upper extremity, especially when he is using the bathroom or holding utensil, writing, but not necessarily at rest. However this is in conjunction with progressive issues of drooling, shuffling gait, holding his elbows up with walking, no falls, but in the setting of chronic dizziness and history of orthostasis as outlined down below.  He denies any fevers, chills, changes to his medications.  Also concerned about his hoarse voice and of course spouses read about  Parkinson's disease in online topics.  Given the constellation of symptoms are quite worried and wish and requested neurology evaluation not for ruling out Parkinson's but also his constellation of symptoms and potential treatments knowing that beta-blockers may actually exacerbate his chronic dizziness.   PAIN:  Are you having pain? No   PATIENT GOALS: Improved with speech and swallowing  OBJECTIVE:  Note: Objective measures were completed at Evaluation unless otherwise noted.  COGNITION: Overall cognitive status: Within functional limits for tasks assessed   PATIENT REPORTED OUTCOME MEASURES (PROM): Communication Effectiveness Survey        How effective is your speech... Pt Rating  Having a conversation with a family member or friends at home 3  Participating in conversation with strangers in a quiet place  3  Conversing with a familiar person over the telephone 4  Conversing with a stranger over the telephone 3  Being part of a conversation in a noisy environment  3  Speaking to a friend when you are emotionally upset or angry 4  Having a conversation while traveling in the car 3  Having a conversation with someone at a distance 2  Total:  25   1= not at all effective                                                                      4= very effective   SUBJECTIVE DYSPHAGIA REPORTS:  Date of onset: 6-12 months ago Reported symptoms: coughing with liquids and on secretions, globus sensation, weak voice, and challenges with secretion management   Current diet: Dysphagia 3 (mechanical soft) and thin liquids  Co-morbid voice changes: Yes                                                                                                                            TREATMENT DATE:   07/23/23: Carl Nichols enters with below WNL volume (68dB) - Completed HEP for hypokinetic dysarthria - required  occasional min verbal cues and modeling for connected warm up and counting with vocal intensity. Usual mod A to achieve and maintain 80-85dB on sustained Ah! Pitch glides completed with mod I at 82-85dB. Counting with average of 80dB with occasional min A. Oral reading paragraph averaged 72dB with rare min A. Conversation exercise averaged 70-72dB with occasional min gesture, verbal cues and modeling. Spontaneous conversation throughout session required usual mod A to achieve and maintain WNL volume 70dB. Ongoing verbal cues to swallow with intent water sips and to swallow saliva prior to talking.   07/15/23: Carl Nichols has completed HEP daily - spouse endorses this. Today, he self corrected low volume speech with occasional min A upon entering room. Targeted volume and ongoing training in HEP of high intensity vocal exercises - resonant warm up, sustained ah and glides averaged 82dB with occasional min A to make exercises smooth and connected. Oral reading at sentence level averaged 74dB with occasional verbal cues and modeling. Conversation exercises required usual semantic cues and modeling to maintain 72-74dB. Intermittent cue to swallow saliva. He did demonstrate throat clear alternatives/throat clear suppression 2x this session with supervision cues. In simple conversation, Carl Nichols used intentional speech to average 70dB with occasional min A over 5 minutes. Water sips throughout session with training/cues to swallow with intent  07/06/23: Introduced concept of using intent for speech and swallowing. Initiated high intensity voice exercises for HEP - Sustained ah required frequent mod A initially to achieve and maintain 85dB over 10 reps. Pitch glide with occasional modeling and visual (bell curve) cues to average 80dB. Counting to 20 averaged 78dB with usual mod modeling and verbal cues, oral reading averaged 74dB with occasional min modeling and verbal cues. Structured speech task generating 3 sentence descriptions of  objects, Carl Nichols maintained 72-74dB with verbal and visual cues. Education provided to swallow more frequently to prevent drool and pooling in pharynx. He required cues to ID  when he has excess saliva in his mouth affecting his speech and when he had wet voice - ongoing cues throughout session to swallow to avoid clearing his throat. Initially frequent throat clearing, however as session progressed, Carl Nichols was able to identify sensation of wanting to throat clear and avoid this with hard swallows. Reviewed the Parkinson [employer] website and encouraged Carl Nichols and Carl Nichols to complete some online home practice sessions as well as the HEP I provided - see patient instructions. Carl Nichols repeatedly expressed that he felt too loud when speaking at WNL dB (70-74dB) ongoing education that this is normal. Carl Nichols also assured Carl Nichols that he was speaking at normal volume.  07/03/2023: Education provided on evaluation results and SLP's recommendations. Pt verbalizes agreement with POC, all questions answered to satisfaction. Pt requesting information about how PD can impact speech and swallow. Education provided with supplemental resources given. SLP expressed he will need to follow with neurologist for potential PD dx, this is outside of SLP scope of practice.    PATIENT EDUCATION: Education details: POC, goals Person educated: Patient Education method: Explanation, Demonstration, and Handouts Education comprehension: verbalized understanding, returned demonstration, and needs further education  HOME EXERCISE PROGRAM: To be established   GOALS: Goals reviewed with patient? Yes  SHORT TERM GOALS: Target date: 08/14/2023  Pt will demo use of dysarthria strategies and compensations during paragraph level reading tasks with 90% accuracy over 2 sessions Baseline: Goal status: ONGOING  2.  Pt will teach back swallow strategies and compensations with mod-I Baseline:  Goal status:ONGOING  3.  Pt will carryover intentional  speech to conversation over 5 minutes, maintaining WNL volume Baseline:  Goal status: ONGOING   LONG TERM GOALS: Target date: 08/28/2023  Pt will complete structured motor speech tasks, meeting target dB in 90% of opportunities and no volume decay Baseline:  Goal status: ONGOING  2.  Pt will maintain WNL volume for 20 minute conversation with rare min-A Baseline:  Goal status: ONGOING  3.  Pt will improve PROM score by 2 pts by d/c Baseline:  Goal status: ONGOING  4.  Pt will report subjective improvement with saliva management with use of trained strategies and compensations  Baseline:  Goal status: ONGOING  ASSESSMENT:  CLINICAL IMPRESSION: Patient is an 83 year old male who is seen today for speech therapy evaluation due to speech and swallow complaints.  Patient presents with a mild dysarthria.  His motor speech is characterized by mildly reduced vocal intensity and occasional imprecise articulation.  He has intact resonance, despite presence of palatal prosthesis.  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  Pt is completing HEP for hypokinetic dysarthria with occasional min A. Usual min to mod A to carryover WNL volume and intent in spontaneous conversation. Spouse reports 50% reduction in her need to ask Carl Nichols to repeat himself. Ongoing education for general swallow precautions and to manage drool. MRI pending, w/u for etiology of reduced gait, tremor and dysarthria. Neurologist is following. Recommend continued skilled ST to maximize intelligibility, swallowing for safety, independence and to reduce caregiver burden.  OBJECTIVE IMPAIRMENTS: Objective impairments include dysarthria and dysphagia. These impairments are limiting patient from effectively communicating at home and in community and safety when swallowing.Factors affecting potential to achieve goals and functional outcome are co-morbidities. Patient will benefit from skilled SLP services to address above impairments and  improve overall function.  REHAB POTENTIAL: Good  PLAN:  SLP FREQUENCY: 1-2x/week (pt elects for 1x week d/t schedule)   SLP DURATION: 8 weeks  PLANNED INTERVENTIONS: Aspiration precaution training, Pharyngeal strengthening exercises, Diet toleration management , Cueing hierachy, Internal/external aids, Functional tasks, SLP instruction and feedback, Compensatory strategies, and Patient/family education   Mathis Leita Caldron, CCC-SLP 07/23/2023, 12:07 PM

## 2023-07-29 ENCOUNTER — Encounter (HOSPITAL_BASED_OUTPATIENT_CLINIC_OR_DEPARTMENT_OTHER): Payer: Self-pay

## 2023-07-30 ENCOUNTER — Encounter: Admitting: Speech Pathology

## 2023-07-30 ENCOUNTER — Inpatient Hospital Stay (HOSPITAL_BASED_OUTPATIENT_CLINIC_OR_DEPARTMENT_OTHER): Admission: RE | Admit: 2023-07-30 | Source: Ambulatory Visit | Admitting: Radiology

## 2023-08-06 ENCOUNTER — Ambulatory Visit (HOSPITAL_BASED_OUTPATIENT_CLINIC_OR_DEPARTMENT_OTHER)
Admission: RE | Admit: 2023-08-06 | Discharge: 2023-08-06 | Disposition: A | Discharge status: Home / Self Care | Source: Ambulatory Visit | Attending: Neurology | Admitting: Neurology

## 2023-08-06 ENCOUNTER — Encounter: Payer: Self-pay | Admitting: Neurology

## 2023-08-06 DIAGNOSIS — R251 Tremor, unspecified: Secondary | ICD-10-CM

## 2023-08-06 DIAGNOSIS — R269 Unspecified abnormalities of gait and mobility: Secondary | ICD-10-CM

## 2023-08-11 ENCOUNTER — Ambulatory Visit: Admitting: Speech Pathology

## 2023-08-11 DIAGNOSIS — R471 Dysarthria and anarthria: Secondary | ICD-10-CM | POA: Diagnosis not present

## 2023-08-11 DIAGNOSIS — R1310 Dysphagia, unspecified: Secondary | ICD-10-CM

## 2023-08-11 NOTE — Therapy (Signed)
 OUTPATIENT SPEECH LANGUAGE PATHOLOGY SPEECH TREATMENT   Patient Name: Carl Nichols MRN: [id number] DOB:October 23, 1940, 83 y.o., male Today's Date: 08/11/2023  PCP: Dr. [name] REFERRING PROVIDER: Dr. 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   ONSET DATE: 06/30/2023 referral date   REFERRING DIAG: R47.81 (ICD-10-CM) - Slurred speech   THERAPY DIAG:  Dysarthria and anarthria  Dysphagia, unspecified type  Rationale for Evaluation and Treatment: Rehabilitation  SUBJECTIVE:   SUBJECTIVE STATEMENT: Carl Nichols is keeping pt on track for using intentional speech in conversation.  Pt accompanied by: self  PERTINENT HISTORY: Per 05/11/23 PCP note: Patient presents for work in visit along with his spouse for concerns over progressive worsening tremor in the right upper extremity, especially when he is using the bathroom or holding utensil, writing, but not necessarily at rest. However this is in conjunction with progressive issues of drooling, shuffling gait, holding his elbows up with walking, no falls, but in the setting of chronic dizziness and history of orthostasis as outlined down below.  He denies any fevers, chills, changes to his medications.  Also concerned about his hoarse voice and of course spouses read about  Parkinson's disease in online topics.  Given the constellation of symptoms are quite worried and wish and requested neurology evaluation not for ruling out Parkinson's but also his constellation of symptoms and potential treatments knowing that beta-blockers may actually exacerbate his chronic dizziness.   PAIN:  Are you having pain? No   PATIENT GOALS: Improved with speech and swallowing  OBJECTIVE:  Note: Objective measures were completed at Evaluation unless otherwise noted.  COGNITION: Overall cognitive status: Within functional limits for tasks assessed   PATIENT REPORTED OUTCOME MEASURES (PROM): Communication Effectiveness Survey        How effective is your speech... Pt Rating  Having a conversation with a family member or friends at home 3  Participating in conversation with strangers in a quiet place  3  Conversing with a familiar person over the telephone 4  Conversing with a stranger over the telephone 3  Being part of a conversation in a noisy environment  3  Speaking to a friend when you are emotionally upset or angry 4  Having a conversation while traveling in the car 3  Having a conversation with someone at a distance 2  Total:  25   1= not at all effective                                                                      4= very effective   SUBJECTIVE DYSPHAGIA REPORTS:  Date of onset: 6-12 months ago Reported symptoms: coughing with liquids and on secretions, globus sensation, weak voice, and challenges with secretion management   Current diet: Dysphagia 3 (mechanical soft) and thin liquids  Co-morbid voice changes: Yes                                                                                                                            TREATMENT DATE:   08/11/23: Pt enters with WNL volume, tells SLP he is speaking with intent. Led pt through speak out lesson.  Pt achieves target dB for all warm up exercises, demonstrating average of 86 dB. Benefits from haa for improved vocal quality for sustained phonation. Over 80dB consistently for counting. Reading exercise completed with variable volume, needing usual visual and verbal cues to maintain intensity, averaging 73 dB d/t volume decay. Pt attributes to challenging reading. Maintains 72+ dB for cognitive speech task naming 5 items across x4 categories. Pt endorses x1 daily practice in afternoon consistently. SLP recommends vocal warm up in morning, at minimum.   07/23/23: Carl Nichols enters with below WNL volume (68dB) - Completed HEP for hypokinetic dysarthria - required occasional min verbal cues and modeling for connected warm up and counting with vocal intensity. Usual mod A to achieve and maintain 80-85dB on sustained Ah! Pitch glides completed with mod I at 82-85dB. Counting with average of 80dB with occasional min A. Oral reading paragraph averaged 72dB with rare min A. Conversation exercise averaged 70-72dB with occasional min gesture, verbal cues and modeling. Spontaneous conversation throughout session required usual mod A to achieve and maintain WNL volume 70dB. Ongoing verbal cues to swallow with intent water sips and to swallow saliva prior to talking.   07/15/23: Carl Nichols has completed HEP daily - spouse endorses this. Today, he self corrected low volume speech with occasional min A upon entering room. Targeted volume and ongoing training in HEP of high intensity vocal exercises - resonant warm up, sustained ah and glides averaged 82dB with occasional min A to make exercises smooth and connected. Oral reading at sentence level averaged 74dB with occasional verbal cues and modeling. Conversation exercises required usual semantic cues and modeling to maintain 72-74dB. Intermittent cue to swallow saliva. He did demonstrate throat clear alternatives/throat clear suppression 2x this session with supervision cues. In simple  conversation, Carl Nichols used intentional speech to average 70dB with occasional min A over 5 minutes. Water sips throughout session with training/cues to swallow with  intent  07/06/23: Introduced concept of using intent for speech and swallowing. Initiated high intensity voice exercises for HEP - Sustained ah required frequent mod A initially to achieve and maintain 85dB over 10 reps. Pitch glide with occasional modeling and visual (bell curve) cues to average 80dB. Counting to 20 averaged 78dB with usual mod modeling and verbal cues, oral reading averaged 74dB with occasional min modeling and verbal cues. Structured speech task generating 3 sentence descriptions of objects, Carl Nichols maintained 72-74dB with verbal and visual cues. Education provided to swallow more frequently to prevent drool and pooling in pharynx. He required cues to ID when he has excess saliva in his mouth affecting his speech and when he had wet voice - ongoing cues throughout session to swallow to avoid clearing his throat. Initially frequent throat clearing, however as session progressed, Carl Nichols was able to identify sensation of wanting to throat clear and avoid this with hard swallows. Reviewed the Parkinson Voice Project website and encouraged Carl Nichols and Carl Nichols to complete some online home practice sessions as well as the HEP I provided - see patient instructions. Carl Nichols repeatedly expressed that he felt too loud when speaking at WNL dB (70-74dB) ongoing education that this is normal. Carl Nichols also assured Carl Nichols that he was speaking at normal volume.  07/03/2023: Education provided on evaluation results and SLP's recommendations. Pt verbalizes agreement with POC, all questions answered to satisfaction. Pt requesting information about how PD can impact speech and swallow. Education provided with supplemental resources given. SLP expressed he will need to follow with neurologist for potential PD dx, this is outside of SLP scope of practice.    PATIENT  EDUCATION: Education details: POC, goals Person educated: Patient Education method: Explanation, Demonstration, and Handouts Education comprehension: verbalized understanding, returned demonstration, and needs further education  HOME EXERCISE PROGRAM: To be established   GOALS: Goals reviewed with patient? Yes  SHORT TERM GOALS: Target date: 08/14/2023  Pt will demo use of dysarthria strategies and compensations during paragraph level reading tasks with 90% accuracy over 2 sessions Baseline: Goal status: ONGOING  2.  Pt will teach back swallow strategies and compensations with mod-I Baseline:  Goal status:ONGOING  3.  Pt will carryover intentional speech to conversation over 5 minutes, maintaining WNL volume Baseline:  Goal status: ONGOING   LONG TERM GOALS: Target date: 08/28/2023  Pt will complete structured motor speech tasks, meeting target dB in 90% of opportunities and no volume decay Baseline:  Goal status: ONGOING  2.  Pt will maintain WNL volume for 20 minute conversation with rare min-A Baseline:  Goal status: ONGOING  3.  Pt will improve PROM score by 2 pts by d/c Baseline:  Goal status: ONGOING  4.  Pt will report subjective improvement with saliva management with use of trained strategies and compensations  Baseline:  Goal status: ONGOING  ASSESSMENT:  CLINICAL IMPRESSION: Patient is an 83 year old male who is seen today for speech therapy evaluation due to speech and swallow complaints.  Patient presents with a mild dysarthria.  His motor speech is characterized by mildly reduced vocal intensity and occasional imprecise articulation.  He has intact resonance, despite presence of palatal prosthesis.  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  Pt is completing HEP for hypokinetic dysarthria with occasional min A. Usual min to mod A to carryover WNL volume and intent in spontaneous conversation. Spouse reports 50% reduction in her need to ask Carl Nichols to repeat  himself. Ongoing education for general swallow precautions and to manage  drool. MRI pending, w/u for etiology of reduced gait, tremor and dysarthria. Neurologist is following. Recommend continued skilled ST to maximize intelligibility, swallowing for safety, independence and to reduce caregiver burden.  OBJECTIVE IMPAIRMENTS: Objective impairments include dysarthria and dysphagia. These impairments are limiting patient from effectively communicating at home and in community and safety when swallowing.Factors affecting potential to achieve goals and functional outcome are co-morbidities. Patient will benefit from skilled SLP services to address above impairments and improve overall function.  REHAB POTENTIAL: Good  PLAN:  SLP FREQUENCY: 1-2x/week (pt elects for 1x week d/t schedule)   SLP DURATION: 8 weeks  PLANNED INTERVENTIONS: Aspiration precaution training, Pharyngeal strengthening exercises, Diet toleration management , Cueing hierachy, Internal/external aids, Functional tasks, SLP instruction and feedback, Compensatory strategies, and Patient/family education   Harlene LITTIE Ned, CCC-SLP 08/11/2023, 8:51 AM

## 2023-08-25 ENCOUNTER — Encounter: Admitting: Speech Pathology

## 2023-08-26 ENCOUNTER — Ambulatory Visit: Attending: Internal Medicine | Admitting: Speech Pathology

## 2023-08-26 ENCOUNTER — Encounter: Payer: Self-pay | Admitting: Speech Pathology

## 2023-08-26 DIAGNOSIS — R471 Dysarthria and anarthria: Secondary | ICD-10-CM | POA: Insufficient documentation

## 2023-08-26 NOTE — Patient Instructions (Signed)
   Swallow before you make a phone call  Add to your practice twice a week  Giving  your name Eliah Pownall D A Y  Address  DOB  Cell  I have some trouble speaking - please be patient with me  Practice  reading your assigned scripture, prayer  requests and  as part of your SPEAK OUT! Daily practice  Call or message Dr. [name]  - ask for Physical Therapy and Occupational Therapy orders  When you get the orders, schedule the evaluations here with Chloe and Burnard (tell them it's a PD evaluation)   PWR! - this is the PD program the PT and OT use here  Do a lesson in the [place]  workbook in the [employer] a webinar in the Lecture series on the website

## 2023-08-26 NOTE — Therapy (Signed)
 OUTPATIENT SPEECH LANGUAGE PATHOLOGY SPEECH TREATMENT & RECERTIFICATION   Patient Name: Carl Nichols MRN: [id number] DOB:[birth year], 83 y.o., male Today's Date: 08/26/2023  PCP: Dr. [name]  REFERRING PROVIDER: Dr. 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   ONSET DATE: 06/30/2023 referral date   REFERRING DIAG: R47.81 (ICD-10-CM) - Slurred speech   THERAPY DIAG:  Dysarthria and anarthria - Plan: SLP plan of care cert/re-cert  Rationale for Evaluation and Treatment: Rehabilitation  SUBJECTIVE:   SUBJECTIVE STATEMENT: I know I have Parkinson's, my tremors are getting worse Pt accompanied by: self  PERTINENT HISTORY: Per 05/11/23 PCP note: Patient presents for work in visit along with his spouse for concerns over progressive worsening tremor in the right upper extremity, especially when he is using the bathroom or holding utensil, writing, but not necessarily at rest. However this is in conjunction with progressive issues of drooling, shuffling gait, holding his elbows up with walking, no falls, but in the setting of chronic dizziness and history of orthostasis as outlined down below.  He denies any fevers, chills, changes to his medications.  Also concerned about his hoarse voice and of course spouses read about Parkinson's disease in online  topics.  Given the constellation of symptoms are quite worried and wish and requested neurology evaluation not for ruling out Parkinson's but also his constellation of symptoms and potential treatments knowing that beta-blockers may actually exacerbate his chronic dizziness.   PAIN:  Are you having pain? No   PATIENT GOALS: Improved with speech and swallowing  OBJECTIVE:  Note: Objective measures were completed at Evaluation unless otherwise noted.  COGNITION: Overall cognitive status: Within functional limits for tasks assessed   PATIENT REPORTED OUTCOME MEASURES (PROM): Communication Effectiveness Survey        How effective is your speech... Pt Rating  Having a conversation with a family member or friends at home 3  Participating in conversation with strangers in a quiet place  3  Conversing with a familiar person over the telephone 4  Conversing with a stranger over the telephone 3  Being part of a conversation in a noisy environment  3  Speaking to a friend when you are emotionally upset or angry 4  Having a conversation while traveling in the car 3  Having a conversation with someone at a distance 2  Total:  25   1= not at all effective                                                                      4= very effective   SUBJECTIVE DYSPHAGIA REPORTS:  Date of onset: 6-12 months ago Reported symptoms: coughing with liquids and on secretions, globus sensation, weak voice, and challenges with secretion management   Current diet: Dysphagia 3 (mechanical soft) and thin liquids  Co-morbid voice changes: Yes                                                                                                                            TREATMENT DATE:   08/26/23: Pt enters with WNL volume, completes SPEAK OUT! Warm ups, sustained vowel, glides and counting hitting target dB with occasional  min verbal, gesture and modeling cues. Spouse reports intelligibility at home is improved, however he is not consistently carrying over strategies to be intelligible without her cueing. Trained pt and spouse in strategies to improve intelligibility over the phone - role play making MD appointment, saying his name, DOB, address and phone with intent, as well as request PT/OT referrals - with rare min cueing, [name] carried over intentional speech and 74dB during role play. Generated a script to let listener know that he is having difficulty with speech and to please be patient with him. Recert today, will request 4 more ST visits.   08/11/23: Pt enters with WNL volume, tells SLP he is speaking with intent. Led pt through speak out lesson. Pt achieves target dB for all warm up exercises, demonstrating average of 86 dB. Benefits from haa for improved vocal quality for sustained phonation. Over 80dB consistently for counting. Reading exercise completed with variable volume, needing usual visual and verbal cues to maintain intensity, averaging 73 dB d/t volume decay. Pt attributes to challenging reading. Maintains 72+ dB for cognitive speech task naming 5 items across x4 categories. Pt endorses x1 daily practice in afternoon consistently. SLP recommends vocal warm up in morning, at minimum.   07/23/23: [name] enters with below WNL volume (68dB) - Completed HEP for hypokinetic dysarthria - required occasional min verbal cues and modeling for connected warm up and counting with vocal intensity. Usual mod A to achieve and maintain 80-85dB on sustained Ah! Pitch glides completed with mod I at 82-85dB. Counting with average of 80dB with occasional min A. Oral reading paragraph averaged 72dB with rare min A. Conversation exercise averaged 70-72dB with occasional min gesture, verbal cues and modeling. Spontaneous conversation throughout session required usual mod A to achieve and maintain WNL volume 70dB. Ongoing verbal cues to  swallow with intent water sips and to swallow saliva prior to talking.  07/15/23: [name] has completed HEP daily - spouse endorses this. Today, he self corrected low volume speech with occasional min A upon entering room. Targeted volume and ongoing training in HEP of high intensity vocal exercises - resonant warm up, sustained ah and glides averaged 82dB with occasional min A to make exercises smooth and connected. Oral reading at sentence level averaged 74dB with occasional verbal cues and modeling. Conversation exercises required usual semantic cues and modeling to maintain 72-74dB. Intermittent cue to swallow saliva. He did demonstrate throat clear alternatives/throat clear suppression 2x this session with supervision cues. In simple conversation, [name] used intentional speech to average 70dB with occasional min A over 5 minutes. Water sips throughout session with training/cues to swallow with intent  07/06/23: Introduced concept of using intent for speech and swallowing. Initiated high intensity voice exercises for HEP - Sustained ah required frequent mod A initially to achieve and maintain 85dB over 10 reps. Pitch glide with occasional modeling and visual (bell curve) cues to average 80dB. Counting to 20 averaged 78dB with usual mod modeling and verbal cues, oral reading averaged 74dB with occasional min modeling and verbal cues. Structured speech task generating 3 sentence descriptions of objects, [name] maintained 72-74dB with verbal and visual cues. Education provided to swallow more frequently to prevent drool and pooling in pharynx. He required cues to ID when he has excess saliva in his mouth affecting his speech and when he had wet voice - ongoing cues throughout session to swallow to avoid clearing his throat. Initially frequent throat clearing, however as session progressed, [name] was able to identify sensation of wanting to throat clear and avoid this with hard swallows. Reviewed the Parkinson Voice  Project website and encouraged [name] and [name] to complete some online home practice sessions as well as the HEP I provided - see patient instructions. [name] repeatedly expressed that he felt too loud when speaking at WNL dB (70-74dB) ongoing education that this is normal. [name] also assured [name] that he was speaking at normal volume.  07/03/2023: Education provided on evaluation results and SLP's recommendations. Pt verbalizes agreement with POC, all questions answered to satisfaction. Pt requesting information about how PD can impact speech and swallow. Education provided with supplemental resources given. SLP expressed he will need to follow with neurologist for potential PD dx, this is outside of SLP scope of practice.    PATIENT EDUCATION: Education details: POC, goals Person educated: Patient Education method: Explanation, Demonstration, and Handouts Education comprehension: verbalized understanding, returned demonstration, and needs further education  HOME EXERCISE PROGRAM: To be established   GOALS: Goals reviewed with patient? Yes  SHORT TERM GOALS: Target date: 08/14/2023  Pt will demo use of dysarthria strategies and compensations during paragraph level reading tasks with 90% accuracy over 2 sessions Baseline: Goal status: MET  2.  Pt will teach back swallow strategies and compensations with mod-I Baseline:  Goal status:MET  3.  Pt will carryover intentional speech to conversation over 5 minutes, maintaining WNL volume Baseline:  Goal status: MET   LONG TERM GOALS: Target date: 09/23/2023 (recert 08/26/23)  Pt will complete structured motor speech tasks, meeting target dB in 90% of opportunities and no volume decay Baseline:  Goal status: MET  2.  Pt will maintain WNL volume for 20 minute conversation with rare min-A Baseline:  Goal status: ONGOING  3.  Pt will improve PROM score by 2 pts by d/c Baseline:  Goal status: ONGOING  4.  Pt will report subjective  improvement with saliva  management with use of trained strategies and compensations  Baseline:  Goal status: ONGOING  ASSESSMENT:  CLINICAL IMPRESSION: Patient is an 83 year old male who is seen today for speech therapy evaluation due to speech and swallow complaints.  Patient presents hypokinetic dysarthria.  His motor speech is characterized by  reduced vocal intensity and occasional imprecise articulation.  Pt is completing HEP for hypokinetic dysarthria with rare min A. He has improved intelligibility, maintaining 72dB average in structured speech tasks and simple short conversation in speech therapy. His wife reports some improvement in intelligibility at home, however she continues to have to frequently remind him to use his compensatory strategies and to repeat himself so she can understand him.  Spouse reports 50% reduction in her need to ask [name] to repeat himself. Ongoing education for general swallow precautions and to manage drool. Ongoing w/u for Parkinson's Disease due to reduced gait, balance, tremor and dysarthria. Neurologist is following. Recommend continued skilled ST to maximize intelligibility, swallowing for safety, independence and to reduce caregiver burden. I also recommend OT/PT evaluations - pt will reach out to PCP to request orders. Requesting 4 additional visits from insurance for ST.  OBJECTIVE IMPAIRMENTS: Objective impairments include dysarthria and dysphagia. These impairments are limiting patient from effectively communicating at home and in community and safety when swallowing.Factors affecting potential to achieve goals and functional outcome are co-morbidities. Patient will benefit from skilled SLP services to address above impairments and improve overall function.  REHAB POTENTIAL: Good  PLAN:  SLP FREQUENCY: 1-2x/week (pt elects for 1x week d/t schedule)   SLP DURATION: 12 weeks  PLANNED INTERVENTIONS: Aspiration precaution training, Pharyngeal strengthening  exercises, Diet toleration management , Cueing hierachy, Internal/external aids, Functional tasks, SLP instruction and feedback, Compensatory strategies, and Patient/family education   Mathis Leita Caldron, CCC-SLP 08/26/2023, 12:09 PM

## 2023-09-07 ENCOUNTER — Encounter: Payer: Self-pay | Admitting: Speech Pathology

## 2023-09-07 ENCOUNTER — Ambulatory Visit: Admitting: Speech Pathology

## 2023-09-07 DIAGNOSIS — R471 Dysarthria and anarthria: Secondary | ICD-10-CM

## 2023-09-07 NOTE — Therapy (Signed)
 OUTPATIENT SPEECH LANGUAGE PATHOLOGY SPEECH TREATMENT & RECERTIFICATION   Patient Name: Carl Nichols MRN: [id number] DOB:March 23, 1940, 83 y.o., male Today's Date: 09/07/2023  PCP: Dr. [name]  REFERRING PROVIDER: Dr. 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   ONSET DATE: 06/30/2023 referral date   REFERRING DIAG: R47.81 (ICD-10-CM) - Slurred speech   THERAPY DIAG:  Dysarthria and anarthria  Rationale for Evaluation and Treatment: Rehabilitation  SUBJECTIVE:   SUBJECTIVE STATEMENT: I know I have Parkinson's, my tremors are getting worse Pt accompanied by: self  PERTINENT HISTORY: Per 05/11/23 PCP note: Patient presents for work in visit along with his spouse for concerns over progressive worsening tremor in the right upper extremity, especially when he is using the bathroom or holding utensil, writing, but not necessarily at rest. However this is in conjunction with progressive issues of drooling, shuffling gait, holding his elbows up with walking, no falls, but in the setting of chronic dizziness and history of orthostasis as outlined down below.  He denies any fevers, chills, changes to his medications.  Also concerned about his hoarse voice and of course spouses read about Parkinson's disease in online topics.  Given the constellation of  symptoms are quite worried and wish and requested neurology evaluation not for ruling out Parkinson's but also his constellation of symptoms and potential treatments knowing that beta-blockers may actually exacerbate his chronic dizziness.   PAIN:  Are you having pain? No   PATIENT GOALS: Improved with speech and swallowing  OBJECTIVE:  Note: Objective measures were completed at Evaluation unless otherwise noted.  COGNITION: Overall cognitive status: Within functional limits for tasks assessed   PATIENT REPORTED OUTCOME MEASURES (PROM): Communication Effectiveness Survey        How effective is your speech... Pt Rating  Having a conversation with a family member or friends at home 3  Participating in conversation with strangers in a quiet place  3  Conversing with a familiar person over the telephone 4  Conversing with a stranger over the telephone 3  Being part of a conversation in a noisy environment  3  Speaking to a friend when you are emotionally upset or angry 4  Having a conversation while traveling in the car 3  Having a conversation with someone at a distance 2  Total:  25   1= not at all effective                                                                      4= very effective   SUBJECTIVE DYSPHAGIA REPORTS:  Date of onset: 6-12 months ago Reported symptoms: coughing with liquids and on secretions, globus sensation, weak voice, and challenges with secretion management   Current diet: Dysphagia 3 (mechanical soft) and thin liquids  Co-morbid voice changes: Yes                                                                                                                            TREATMENT DATE:   09/07/23: Carl Nichols enters with WNL volume, continues to complete SPEAK OUT! Using booklet and on line. He maintains volume to meet dB targets in HEP for exercises and conversation  tasks. He is swallowing saliva more frequently with rare min A. Practiced role play/carryover of self advocating script for speech difficulties. In 12 minute conversation, Carl Nichols maintained 70-72dB with ongoing education re: sensory deficits and difficulty knowing how loud and clear his speech sounds - he continues to question if he is talking too loud with frequent reassurance he is speaking at WNL volume.   08/26/23: Pt enters with WNL volume, completes SPEAK OUT! Warm ups, sustained vowel, glides and counting hitting target dB with occasional min verbal, gesture and modeling cues. Spouse reports intelligibility at home is improved, however he is not consistently carrying over strategies to be intelligible without her cueing. Trained pt and spouse in strategies to improve intelligibility over the phone - role play making MD appointment, saying his name, DOB, address and phone with intent, as well as request PT/OT referrals - with rare min cueing, Carl Nichols carried over intentional speech and 74dB during role play. Generated a script to let listener know that he is having difficulty with speech and to please be patient with him. Recert today, will request 4 more ST visits.   08/11/23: Pt enters with WNL volume, tells SLP he is speaking with intent. Led pt through speak out lesson. Pt achieves target dB for all warm up exercises, demonstrating average of 86 dB. Benefits from haa for improved vocal quality for sustained phonation. Over 80dB consistently for counting. Reading exercise completed with variable volume, needing usual visual and verbal cues to maintain intensity, averaging 73 dB d/t volume decay. Pt attributes to challenging reading. Maintains 72+ dB for cognitive speech task naming 5 items across x4 categories. Pt endorses x1 daily practice in afternoon consistently. SLP recommends vocal warm up in morning, at minimum.   07/23/23: Carl Nichols enters with below WNL volume (68dB) - Completed HEP for hypokinetic  dysarthria - required  occasional min verbal cues and modeling for connected warm up and counting with vocal intensity. Usual mod A to achieve and maintain 80-85dB on sustained Ah! Pitch glides completed with mod I at 82-85dB. Counting with average of 80dB with occasional min A. Oral reading paragraph averaged 72dB with rare min A. Conversation exercise averaged 70-72dB with occasional min gesture, verbal cues and modeling. Spontaneous conversation throughout session required usual mod A to achieve and maintain WNL volume 70dB. Ongoing verbal cues to swallow with intent water sips and to swallow saliva prior to talking.   07/15/23: Carl Nichols has completed HEP daily - spouse endorses this. Today, he self corrected low volume speech with occasional min A upon entering room. Targeted volume and ongoing training in HEP of high intensity vocal exercises - resonant warm up, sustained ah and glides averaged 82dB with occasional min A to make exercises smooth and connected. Oral reading at sentence level averaged 74dB with occasional verbal cues and modeling. Conversation exercises required usual semantic cues and modeling to maintain 72-74dB. Intermittent cue to swallow saliva. He did demonstrate throat clear alternatives/throat clear suppression 2x this session with supervision cues. In simple conversation, Carl Nichols used intentional speech to average 70dB with occasional min A over 5 minutes. Water sips throughout session with training/cues to swallow with intent  07/06/23: Introduced concept of using intent for speech and swallowing. Initiated high intensity voice exercises for HEP - Sustained ah required frequent mod A initially to achieve and maintain 85dB over 10 reps. Pitch glide with occasional modeling and visual (bell curve) cues to average 80dB. Counting to 20 averaged 78dB with usual mod modeling and verbal cues, oral reading averaged 74dB with occasional min modeling and verbal cues. Structured speech task generating 3  sentence descriptions of objects, Carl Nichols maintained 72-74dB with verbal and visual cues. Education provided to swallow more frequently to prevent drool and pooling in pharynx. He required cues to ID when he has excess saliva in his mouth affecting his speech and when he had wet voice - ongoing cues throughout session to swallow to avoid clearing his throat. Initially frequent throat clearing, however as session progressed, Carl Nichols was able to identify sensation of wanting to throat clear and avoid this with hard swallows. Reviewed the [employer] website and encouraged Carl Nichols and [name] to complete some online home practice sessions as well as the HEP I provided - see patient instructions. Carl Nichols repeatedly expressed that he felt too loud when speaking at WNL dB (70-74dB) ongoing education that this is normal. [name] also assured Carl Nichols that he was speaking at normal volume.  07/03/2023: Education provided on evaluation results and SLP's recommendations. Pt verbalizes agreement with POC, all questions answered to satisfaction. Pt requesting information about how PD can impact speech and swallow. Education provided with supplemental resources given. SLP expressed he will need to follow with neurologist for potential PD dx, this is outside of SLP scope of practice.    PATIENT EDUCATION: Education details: POC, goals Person educated: Patient Education method: Explanation, Demonstration, and Handouts Education comprehension: verbalized understanding, returned demonstration, and needs further education  HOME EXERCISE PROGRAM: To be established   GOALS: Goals reviewed with patient? Yes  SHORT TERM GOALS: Target date: 08/14/2023  Pt will demo use of dysarthria strategies and compensations during paragraph level reading tasks with 90% accuracy over 2 sessions Baseline: Goal status: MET  2.  Pt will teach back swallow strategies and compensations with mod-I Baseline:  Goal status:MET  3.  Pt will  carryover  intentional speech to conversation over 5 minutes, maintaining WNL volume Baseline:  Goal status: MET   LONG TERM GOALS: Target date: 09/23/2023 (recert 08/26/23)  Pt will complete structured motor speech tasks, meeting target dB in 90% of opportunities and no volume decay Baseline:  Goal status: MET  2.  Pt will maintain WNL volume for 20 minute conversation with rare min-A Baseline:  Goal status: ONGOING  3.  Pt will improve PROM score by 2 pts by d/c Baseline:  Goal status: ONGOING  4.  Pt will report subjective improvement with saliva management with use of trained strategies and compensations  Baseline:  Goal status: ONGOING  ASSESSMENT:  CLINICAL IMPRESSION: Patient is an 83 year old male who is seen today for speech therapy evaluation due to speech and swallow complaints.  Patient presents hypokinetic dysarthria.  His motor speech is characterized by  reduced vocal intensity and occasional imprecise articulation.  Pt is completing HEP for hypokinetic dysarthria with rare min A. He has improved intelligibility, maintaining 72dB average in structured speech tasks and simple short conversation in speech therapy. His wife reports some improvement in intelligibility at home, however she continues to have to frequently remind him to use his compensatory strategies and to repeat himself so she can understand him.  Spouse reports 50% reduction in her need to ask Carl Nichols to repeat himself. Ongoing education for general swallow precautions and to manage drool. Ongoing w/u for Parkinson's Disease due to reduced gait, balance, tremor and dysarthria. Neurologist is following. Recommend continued skilled ST to maximize intelligibility, swallowing for safety, independence and to reduce caregiver burden. I also recommend OT/PT evaluations - pt will reach out to PCP to request orders. Requesting 4 additional visits from insurance for ST.  OBJECTIVE IMPAIRMENTS: Objective impairments include  dysarthria and dysphagia. These impairments are limiting patient from effectively communicating at home and in community and safety when swallowing.Factors affecting potential to achieve goals and functional outcome are co-morbidities. Patient will benefit from skilled SLP services to address above impairments and improve overall function.  REHAB POTENTIAL: Good  PLAN:  SLP FREQUENCY: 1-2x/week (pt elects for 1x week d/t schedule)   SLP DURATION: 12 weeks  PLANNED INTERVENTIONS: Aspiration precaution training, Pharyngeal strengthening exercises, Diet toleration management , Cueing hierachy, Internal/external aids, Functional tasks, SLP instruction and feedback, Compensatory strategies, and Patient/family education   Mathis Leita Caldron, CCC-SLP 09/07/2023, 3:34 PM

## 2023-09-07 NOTE — Patient Instructions (Signed)
   Sensory deficits in Parkinson's  You may feel like you're speaking loud enough but sensory changes in Parkinson's can make your speech softer than you realize  Parkinson's affects sensory awareness. It can make it harder to know how loud or clear your speech sounds.  It's not just about speaking louder, it's about re-training your brain to recognize what louder really feels like  SPEAK OUT! Uses INTENT to help you REGAIN and RETAIN speech and swallowing

## 2023-09-08 ENCOUNTER — Encounter: Payer: Self-pay | Admitting: Speech Pathology

## 2023-09-08 ENCOUNTER — Ambulatory Visit: Admitting: Speech Pathology

## 2023-09-08 DIAGNOSIS — R471 Dysarthria and anarthria: Secondary | ICD-10-CM

## 2023-09-08 NOTE — Therapy (Signed)
 OUTPATIENT SPEECH LANGUAGE PATHOLOGY SPEECH TREATMENT & DISCHARGE SUMMARY   Patient Name: Carl Nichols MRN: [id number] DOB:Jun 30, 1940, 83 y.o., male Today's Date: 09/08/2023  PCP: Dr. [name]  REFERRING PROVIDER: Dr. 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   ONSET DATE: 06/30/2023 referral date   REFERRING DIAG: R47.81 (ICD-10-CM) - Slurred speech   THERAPY DIAG:  Dysarthria and anarthria  Rationale for Evaluation and Treatment: Rehabilitation  SUBJECTIVE:   SUBJECTIVE STATEMENT: I know I have Parkinson's, my tremors are getting worse Pt accompanied by: self  PERTINENT HISTORY: Per 05/11/23 PCP note: Patient presents for work in visit along with his spouse for concerns over progressive worsening tremor in the right upper extremity, especially when he is using the bathroom or holding utensil, writing, but not necessarily at rest. However this is in conjunction with progressive issues of drooling, shuffling gait, holding his elbows up with walking, no falls, but in the setting of chronic dizziness and history of orthostasis as outlined down below.  He denies any fevers, chills, changes to his medications.  Also concerned about his hoarse voice and of course spouses read about Parkinson's disease in online topics.  Given the constellation of  symptoms are quite worried and wish and requested neurology evaluation not for ruling out Parkinson's but also his constellation of symptoms and potential treatments knowing that beta-blockers may actually exacerbate his chronic dizziness.   PAIN:  Are you having pain? No   PATIENT GOALS: Improved with speech and swallowing  OBJECTIVE:  Note: Objective measures were completed at Evaluation unless otherwise noted.  COGNITION: Overall cognitive status: Within functional limits for tasks assessed   PATIENT REPORTED OUTCOME MEASURES (PROM): Communication Effectiveness Survey        How effective is your speech... Pt Rating  Having a conversation with a family member or friends at home 3  2.5 post  Participating in conversation with strangers in a quiet place  3  2.5  Conversing with a familiar person over the telephone 4   2.5  Conversing with a stranger over the telephone 3   3  Being part of a conversation in a noisy environment  3   2  Speaking to a friend when you are emotionally upset or angry 4   3  Having a conversation while traveling in the car 3   3  Having a conversation with someone at a distance 2   3  Total:  25 21.5 (post)  1= not at all effective                                                                      4= very effective   SUBJECTIVE DYSPHAGIA REPORTS:  Date of onset: 6-12 months ago Reported symptoms: coughing with liquids and on secretions, globus sensation, weak voice, and challenges with secretion management   Current diet: Dysphagia 3 (mechanical soft) and thin liquids  Co-morbid voice changes: Yes                                                                                                                            TREATMENT DATE:   09/08/23: Carl Nichols enters with WNL volume - he endorses setting a schedule to practice HEP in the am after breakfast with spouse.  Carl Nichols completes SPEAK OUT warm ups and exercises with mod I and achieves all dB targets. Re administered PROM with score declined due to improved awareness of speech difficulties - he did improve on speaking across a room at a distance which spouse endorses has improved. Cognitive exercise - averaged 75dB with mod I - In conversation, Carl Nichols averaged 73dB with supervision cues over 18 minutes. He endorses improved drool management - noted to swallow more frequently when speaking and reading with mod I.   09/07/23: Carl Nichols enters with WNL volume, continues to complete SPEAK OUT! Using booklet and on line. He maintains volume to meet dB targets in HEP for exercises and conversation tasks. He is swallowing saliva more frequently with rare min A. Practiced role play/carryover of self advocating script for speech difficulties. In 12 minute conversation, Carl Nichols maintained 70-72dB with ongoing education re: sensory deficits and difficulty knowing how loud and clear his speech sounds - he continues to question if he is talking too loud with frequent reassurance he is speaking at WNL volume.   08/26/23: Pt enters with WNL volume, completes SPEAK OUT! Warm ups, sustained vowel, glides and counting hitting target dB with occasional min verbal, gesture and modeling cues. Spouse reports intelligibility at home is improved, however he is not consistently carrying over strategies to be intelligible without her cueing. Trained pt and spouse in strategies to improve intelligibility over the phone - role play making MD appointment, saying his name, DOB, address and phone with intent, as well as request PT/OT referrals - with rare min cueing, Carl Nichols carried over intentional speech and 74dB during role play. Generated a script to let listener know that he is having difficulty with speech and to please be patient with him. Recert today, will request 4 more ST visits.   08/11/23: Pt enters with WNL volume, tells SLP he is speaking with intent.  Led pt through speak out lesson. Pt achieves target dB for all warm up exercises, demonstrating average of 86 dB. Benefits from haa for improved vocal quality for sustained phonation. Over 80dB consistently for counting. Reading exercise completed with variable volume, needing usual visual and verbal cues to maintain intensity, averaging 73 dB d/t volume decay. Pt attributes to challenging reading. Maintains 72+ dB for cognitive speech task naming 5 items across x4 categories. Pt endorses x1 daily practice in afternoon consistently. SLP recommends vocal warm up in morning, at minimum.   07/23/23: Carl Nichols enters with below WNL volume (68dB) - Completed HEP for hypokinetic dysarthria - required occasional min verbal cues and modeling for connected warm up and counting with vocal intensity. Usual mod A to achieve and maintain 80-85dB on sustained Ah! Pitch glides completed with mod I at 82-85dB. Counting with average of 80dB with occasional min A. Oral reading paragraph averaged 72dB with rare min A. Conversation exercise averaged 70-72dB with occasional min gesture, verbal cues and modeling. Spontaneous conversation throughout session required usual mod A to achieve and maintain WNL volume 70dB. Ongoing verbal cues to swallow with intent water sips and to swallow saliva prior to talking.   07/15/23: Carl Nichols has completed HEP daily - spouse endorses this. Today, he self corrected low volume speech with occasional min A upon entering room. Targeted volume and ongoing training in HEP of high intensity vocal exercises - resonant warm up, sustained ah and glides averaged 82dB with occasional min A to make exercises smooth and connected. Oral reading at sentence level averaged 74dB with occasional verbal cues and modeling. Conversation exercises required usual semantic cues and modeling to maintain 72-74dB. Intermittent cue to swallow saliva. He did demonstrate throat clear alternatives/throat clear suppression 2x this session  with supervision cues. In simple conversation, Carl Nichols used intentional speech to average 70dB with occasional min A over 5 minutes. Water sips throughout session with training/cues to swallow with intent  07/06/23: Introduced concept of using intent for speech and swallowing. Initiated high intensity voice exercises for HEP - Sustained ah required frequent mod A initially to achieve and maintain 85dB over 10 reps. Pitch glide with occasional modeling and visual (bell curve) cues to average 80dB. Counting to 20 averaged 78dB with usual mod modeling and verbal cues, oral reading averaged 74dB with occasional min modeling and verbal cues. Structured speech task generating 3 sentence descriptions of objects, Carl Nichols maintained 72-74dB with verbal and visual cues. Education provided to swallow more frequently to prevent drool and pooling in pharynx. He required cues to ID when he has excess saliva in his mouth affecting his speech and when he had wet voice - ongoing cues throughout session to swallow to avoid clearing his throat. Initially frequent throat clearing, however as session progressed, Carl Nichols was able to identify sensation of wanting to throat clear and avoid this with hard swallows. Reviewed the [employer] website and encouraged Carl Nichols and [name] to complete some online home practice sessions as well as the HEP I provided - see patient instructions. Carl Nichols repeatedly expressed that he felt too loud when speaking at WNL dB (70-74dB) ongoing education that this is normal. [name] also assured Carl Nichols that he was speaking at normal volume.  07/03/2023: Education provided on evaluation results and SLP's recommendations. Pt verbalizes agreement with POC, all questions answered to satisfaction. Pt requesting information about how PD can impact speech and swallow. Education provided with supplemental resources given. SLP expressed he will need to follow with neurologist for potential  PD dx, this is outside of SLP scope  of practice.    PATIENT EDUCATION: Education details: POC, goals Person educated: Patient Education method: Explanation, Demonstration, and Handouts Education comprehension: verbalized understanding, returned demonstration, and needs further education  HOME EXERCISE PROGRAM: To be established   GOALS: Goals reviewed with patient? Yes  SHORT TERM GOALS: Target date: 08/14/2023  Pt will demo use of dysarthria strategies and compensations during paragraph level reading tasks with 90% accuracy over 2 sessions Baseline: Goal status: MET  2.  Pt will teach back swallow strategies and compensations with mod-I Baseline:  Goal status:MET  3.  Pt will carryover intentional speech to conversation over 5 minutes, maintaining WNL volume Baseline:  Goal status: MET   LONG TERM GOALS: Target date: 09/23/2023 (recert 08/26/23)  Pt will complete structured motor speech tasks, meeting target dB in 90% of opportunities and no volume decay Baseline:  Goal status: MET  2.  Pt will maintain WNL volume for 20 minute conversation with rare min-A Baseline:  Goal status: MET  3.  Pt will improve PROM score by 2 pts by d/c Baseline:  Goal status: PARTIALLY MET - score declined due improved awareness of speech difficulties  4.  Pt will report subjective improvement with saliva management with use of trained strategies and compensations  Baseline:  Goal status: MET  ASSESSMENT:  CLINICAL IMPRESSION: Patient is an 83 year old male who is seen today for speech therapy evaluation due to speech and swallow complaints.  Patient presents hypokinetic dysarthria.  His motor speech is characterized by  reduced vocal intensity and occasional imprecise articulation.  Pt is completing HEP for hypokinetic dysarthria with mod I. He has improved intelligibility, maintaining 75dB average in structured speech tasks and simple short conversation in speech therapy. His wife reports some improvement in intelligibility  at home, Spouse reports 50% reduction in her need to ask Carl Nichols to repeat himself. Ongoing education for general swallow precautions and to manage drool. Ongoing w/u for Parkinson's Disease due to reduced gait, balance, tremor and dysarthria. Neurologist is following. Goals met, d/c ST at this time. Pt is to return for PT and OT next month.   OBJECTIVE IMPAIRMENTS: Objective impairments include dysarthria and dysphagia. These impairments are limiting patient from effectively communicating at home and in community and safety when swallowing.Factors affecting potential to achieve goals and functional outcome are co-morbidities. Patient will benefit from skilled SLP services to address above impairments and improve overall function.  REHAB POTENTIAL: Good  PLAN:  SLP FREQUENCY: 1-2x/week (pt elects for 1x week d/t schedule)   SLP DURATION: 12 weeks  PLANNED INTERVENTIONS: Aspiration precaution training, Pharyngeal strengthening exercises, Diet toleration management , Cueing hierachy, Internal/external aids, Functional tasks, SLP instruction and feedback, Compensatory strategies, and Patient/family education  SPEECH THERAPY DISCHARGE SUMMARY  Visits from Start of Care: 8  Current functional level related to goals / functional outcomes: See goals above   Remaining deficits: Hypokinetic dysarthria, drool,    Education / Equipment: HEP for dysarthria, drool management, compensations for dysarthria and dysphagia  Patient agrees to discharge. Patient goals were met. Patient is being discharged due to meeting the stated rehab goals. .     Prajna Vanderpool Ann, CCC-SLP 09/08/2023, 10:44 AM

## 2023-09-09 ENCOUNTER — Ambulatory Visit: Admitting: Neurology

## 2023-09-18 ENCOUNTER — Encounter: Payer: Self-pay | Admitting: Speech Pathology

## 2023-09-22 ENCOUNTER — Encounter: Admitting: Occupational Therapy

## 2023-09-22 ENCOUNTER — Ambulatory Visit: Admitting: Physical Therapy

## 2023-09-24 ENCOUNTER — Ambulatory Visit: Admitting: Physical Therapy

## 2023-09-24 ENCOUNTER — Ambulatory Visit: Admitting: Occupational Therapy

## 2023-09-28 NOTE — Therapy (Addendum)
 OUTPATIENT OCCUPATIONAL THERAPY NEURO EVALUATION  Patient Name: Carl Nichols MRN: [id number] DOB:[birth year], 83 y.o., male Today's Date: 09/30/2023  PCP: Carl Santos, MD REFERRING PROVIDER: [name] Santos, MD  END OF SESSION:  OT End of Session - 09/29/23 1349     Visit Number 1    Number of Visits 17    Date for OT Re-Evaluation 11/29/23    Authorization Type [employer] MCR (therapist emailed for auth)    Authorization Time Period visit limit [place]    Progress Note Due on Visit 10    OT Start Time 1230    OT Stop Time 1315    OT Time Calculation (min) 45 min    Activity Tolerance Patient tolerated treatment well    Behavior During Therapy [facility] for tasks assessed/performed          Past Medical History:  Diagnosis Date   Dizziness    Pneumonia    march 2018   Past Surgical History:  Procedure Laterality Date   CATARACT EXTRACTION Right    jan 2013   CATARACT EXTRACTION Left    feb 2013   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   ONSET DATE: 09/07/2023  REFERRING DIAG: R29.898 (ICD-10-CM) - Other symptoms and signs involving the musculoskeletal system  THERAPY DIAG:  Other lack of coordination - Plan: Ot plan of care cert/re-cert  Muscle weakness (generalized) - Plan: Ot plan of care cert/re-cert  Other symptoms and signs involving the nervous system - Plan: Ot plan of care cert/re-cert  Abnormal posture - Plan: Ot plan of care cert/re-cert  Other symptoms and signs involving the musculoskeletal system - Plan: Ot plan of care cert/re-cert  Unsteadiness on feet - Plan: Ot plan of care cert/re-cert  Rationale for Evaluation and Treatment: Rehabilitation  SUBJECTIVE:   SUBJECTIVE STATEMENT: Pt reports no pain this date and reports tremors in right hand that are worse at rest, If I focus on it I can make it stop. Pt  accompanied by: significant other spouse Carl Nichols  PERTINENT HISTORY: Pt is an 83 year old man who has had progressive worsening of gait, orthostatic hypotension, and a right arm tremor. Concerns noted at most recent office visit with [facility] on 07/20/23 for Parkinson's but did not fulfill enough criteria at that time (normal muscle tone, no gait retropulsion, no definite bradykinesia). Noted less physical endurance and worsening RUE tremor when completing tasks such as toileting, holding feeding utensils, and writing. Pt was receiving SLP services at this location from 07/03/23-09/08/23 PRECAUTIONS: Fall  WEIGHT BEARING RESTRICTIONS: No  PAIN:  Are you having pain? No  FALLS: Has patient fallen in last 6 months? No  LIVING ENVIRONMENT: Lives with: lives with their spouse Lives in: [place] home, level entry, 5 STE to bedroom on left side going down and rail, den 8 on left side going down Has following equipment at home: Grab bars in walk in shower, high toilet seat  PLOF: Independentneeds some help with buttons, jackets, slower with self feeding, more difficulty recently with wiping  PATIENT GOALS: I would like to improve my handwriting and strengthen my R arm.   OBJECTIVE:  Note: Objective measures were completed at Evaluation unless otherwise noted.  HAND DOMINANCE: Right  ADLs: Overall ADLs: I/Mod I with ADLs, reported increased time with  buttons and putting on jacket Transfers/ambulation related to ADLs: Eating: slower eating Grooming: I UB Dressing: difficulty/slower w/ buttons LB Dressing: I Toileting: slightly slower with wiping  Bathing: I/Mod I Tub Shower transfers: I Equipment: Grab bars and Walk in shower  IADLs: Shopping: I Light housekeeping: I Meal Prep: spouse does cooking, pt cleans Community mobility: I Medication management: Mod I with pill organizer, no difficulty manipulating pills Financial management: I Handwriting: 95-100%  legible in print, Mild micrographia, Moderate micrographia, and some spacing issues noted as the words are fairly close together. Reports writing print and micrographia getting worse with fatigue.  MOBILITY STATUS: Independent is still driving, some stiffness in neck reported. Slower getting seatbelt on per spouse report.   POSTURE COMMENTS:  rounded shoulders and forward head Sitting balance: WFL  ACTIVITY TOLERANCE: Activity tolerance: Reports some increased fatigue, unsure if d/t old age or Parkinson's.  FUNCTIONAL OUTCOME MEASURES: Simulated eating (PPT#2) in R dominant hand: 11.70 seconds Simulated donning/doffing jacket (PPT #4): 20.85 seconds  3 button/unbutton: 27.28 seconds   UPPER EXTREMITY ROM:  WNL, some cueing required to fully extend elbows when flexing BUEs to 90 degrees.  Active ROM Right eval Left eval  Shoulder flexion    Shoulder abduction    Shoulder adduction    Shoulder extension    Shoulder internal rotation    Shoulder external rotation    Elbow flexion    Elbow extension    Wrist flexion    Wrist extension    Wrist ulnar deviation    Wrist radial deviation    Wrist pronation    Wrist supination    (Blank rows = not tested)  UPPER EXTREMITY MMT:   LUE WNL  MMT Right eval Left eval  Shoulder flexion 4   Shoulder abduction    Shoulder adduction    Shoulder extension    Shoulder internal rotation    Shoulder external rotation    Middle trapezius    Lower trapezius    Elbow flexion 4   Elbow extension    Wrist flexion    Wrist extension    Wrist ulnar deviation    Wrist radial deviation    Wrist pronation    Wrist supination    (Blank rows = not tested)  HAND FUNCTION: Grip strength: Right: 57.3 lbs; Left: 52.6 lbs  COORDINATION: 9 Hole Peg test: Right: 24.80 sec; Left: 33.96 sec Box and Blocks:  Right 49 blocks, Left 50 blocks- noted pt at times did not clear partition. Tremors: Resting and Right Simulated eating (PPT#2) in R  dominant hand: 11.70 seconds Simulated donning/doffing jacket (PPT #4): 20.85 seconds  3 button/unbutton: 27.28 seconds   SENSATION: WFL  EDEMA: swelling in B ankles, PT aware.  MUSCLE TONE: RUE: slight catch, minimal cogwheel ridigity   COGNITION: Overall cognitive status: Within functional limits for tasks assessed  VISION: Subjective report: Slight changes in vision over the years.  Baseline vision: Wears glasses all the time Visual history: cataract surgery  VISION ASSESSMENT: WFL  Patient has difficulty with following activities due to following visual impairments:  PERCEPTION: Not tested  PRAXIS: Not tested  OBSERVATIONS: Concerns with handwriting noted and some slight weakness noted in RUE. Some stiffness noted as well and mild cogwheel rigidity.  TREATMENT DATE: 09/29/23      See below   PATIENT EDUCATION: Education details: Educated pt in purpose of OT and plan of tx including goals. Person educated: Patient and Spouse Education method: Explanation and Verbal cues Education comprehension: verbalized understanding     GOALS: Goals reviewed with patient? Yes  SHORT TERM GOALS: Target date: 10/29/23 GOALS:  SHORT TERM GOALS: Target date: 10/29/23    Pt will be independent with PD specific HEP.  Baseline: not yet initiated Goal status: INITIAL  2.  Pt will verbalize understanding of adapted strategies to maximize safety and independence with ADLs/IADLs.  Baseline: not yet initiated Goal status: INITIAL  3.  Pt will write a sentence with no significant decrease in size and maintain 95% legibility and spacing.   Baseline: 95-100% legibility Goal status: INITIAL  4.  Pt will demonstrate improved fine motor coordination for ADLs as evidenced by decreasing 9 hole peg test score for L hand by at least 5 seconds.  Baseline:  33.96 seconds Goal status: INITIAL  5.  Pt will demonstrate improved ease with fastening buttons as evidenced by decreasing 3 button/unbutton time by at least 25 seconds.  Baseline: 27.85 seconds Goal status: INITIAL  6.  Pt will be able to place at least 3 or more blocks using bilateral hand with completion of Box and Blocks test.  Baseline: 49 R hand, 50 L hand blocks Goal status: INITIAL   LONG TERM GOALS: Target date: 11/29/23   Pt will verbalize understanding of ways to prevent future PD related complications and PD community resources.  Baseline: not yet initiated Goal status: INITIAL  2.  Pt will write a short paragraph with no significant decrease in size and maintain 100% legibility.  Baseline: 95-100% legibility with spacing concerns Goal status: INITIAL  3.  Pt will verbalize understanding of ways to keep thinking skills sharp and ways to compensate for STM changes in the future.  Baseline: not yet initiated Goal status: INITIAL  4.  Pt will demonstrate increased ease with dressing as evidenced by decreasing PPT#4 (don/ doff jacket) to 16 secs or less.  Baseline: 20.85 seconds Goal status: INITIAL  5. Pt will improve RUE shoulder and elbow flexion strength to 5/5 for improved ability to complete functional transfers and complete dressing tasks.   Baseline: 4/5   Goal status: INITIAL        ASSESSMENT:  CLINICAL IMPRESSION: Patient is a 83 y.o. male who was seen today for occupational therapy evaluation for other signs and symptoms involving musculoskeletal system, possible Parkinson's however pt at this time is not diagnosed, is seeking neurology appt. Hx includes dizziness, pneumonia, cataract surgery. Patient currently presents minimally below baseline level of functioning demonstrating functional deficits and impairments as noted below. Pt would benefit from skilled OT services in the outpatient setting to work on impairments as noted below to help pt  return to PLOF as able.    PERFORMANCE DEFICITS: in functional skills including ADLs, coordination, tone, strength, Fine motor control, and tremors.   IMPAIRMENTS: are limiting patient from ADLs and IADLs.   CO-MORBIDITIES: may have co-morbidities  that affects occupational performance. Patient will benefit from skilled OT to address above impairments and improve overall function.  MODIFICATION OR ASSISTANCE TO COMPLETE EVALUATION: Min-Moderate modification of tasks or assist with assess necessary to complete an evaluation.  OT OCCUPATIONAL PROFILE AND HISTORY: Detailed assessment: Review of records and additional review of physical, cognitive, psychosocial history related to current functional performance.  CLINICAL DECISION MAKING:  Moderate - several treatment options, min-mod task modification necessary  REHAB POTENTIAL: Good  EVALUATION COMPLEXITY: Moderate    PLAN:  OT FREQUENCY: 2x/week  OT DURATION: 8 weeks +eval  PLANNED INTERVENTIONS: 97168 OT Re-evaluation, 97535 self care/ADL training, 02889 therapeutic exercise, 97530 therapeutic activity, 97112 neuromuscular re-education, 97140 manual therapy, 97039 fluidotherapy, 97010 moist heat, 97760 Orthotic Initial, 97763 Orthotic/Prosthetic subsequent, functional mobility training, visual/perceptual remediation/compensation, energy conservation, coping strategies training, patient/family education, and DME and/or AE instructions  RECOMMENDED OTHER SERVICES: none  CONSULTED AND AGREED WITH PLAN OF CARE: Patient and family member/caregiver  PLAN FOR NEXT SESSION: PWR moves as appropriate, FM coordination activities, handwriting, RUE strengthening   Wanona Stare, OT 09/30/2023, 10:42 AM

## 2023-09-29 ENCOUNTER — Ambulatory Visit: Attending: Internal Medicine | Admitting: Physical Therapy

## 2023-09-29 ENCOUNTER — Encounter: Payer: Self-pay | Admitting: Physical Therapy

## 2023-09-29 ENCOUNTER — Ambulatory Visit

## 2023-09-29 VITALS — BP 124/56 | HR 80

## 2023-09-29 DIAGNOSIS — R2681 Unsteadiness on feet: Secondary | ICD-10-CM | POA: Diagnosis present

## 2023-09-29 DIAGNOSIS — M6281 Muscle weakness (generalized): Secondary | ICD-10-CM | POA: Diagnosis present

## 2023-09-29 DIAGNOSIS — R29818 Other symptoms and signs involving the nervous system: Secondary | ICD-10-CM

## 2023-09-29 DIAGNOSIS — R293 Abnormal posture: Secondary | ICD-10-CM

## 2023-09-29 DIAGNOSIS — R278 Other lack of coordination: Secondary | ICD-10-CM | POA: Diagnosis present

## 2023-09-29 DIAGNOSIS — R29898 Other symptoms and signs involving the musculoskeletal system: Secondary | ICD-10-CM | POA: Diagnosis present

## 2023-09-29 DIAGNOSIS — R2689 Other abnormalities of gait and mobility: Secondary | ICD-10-CM | POA: Diagnosis present

## 2023-09-29 NOTE — Therapy (Signed)
 OUTPATIENT PHYSICAL THERAPY NEURO EVALUATION   Patient Name: Carl Nichols MRN: [id number] DOB:1940-03-12, 83 y.o., male Today's Date: 09/29/2023   PCP: [name] Santos, MD  REFERRING PROVIDER: [name] Santos, MD  END OF SESSION:  PT End of Session - 09/29/23 1151     Visit Number 1    Number of Visits 9    Date for PT Re-Evaluation 11/10/23   due to potential delay in scheduling   Authorization Type [employer]ICARE    PT Start Time 1149    PT Stop Time 1228    PT Time Calculation (min) 39 min    Equipment Utilized During Treatment Gait belt    Activity Tolerance Patient tolerated treatment well    Behavior During Therapy [facility] for tasks assessed/performed          Past Medical History:  Diagnosis Date   Dizziness    Pneumonia    march 2018   Past Surgical History:  Procedure Laterality Date   CATARACT EXTRACTION Right    jan 2013   CATARACT EXTRACTION Left    feb 2013   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   ONSET DATE: 08/28/2023  REFERRING DIAG: R26.0 (ICD-10-CM) - Staggering gait  THERAPY DIAG:  Unsteadiness on feet  Other abnormalities of gait and mobility  Abnormal posture  Rationale for Evaluation and Treatment: Rehabilitation  SUBJECTIVE:                                                                                                                                                                                             SUBJECTIVE STATEMENT: ST recommended that pt do OT and ST. Saw speech therapy earlier this year to help with speaking with intent. Saw Carl Nichols back in July and per his note My biggest concern is that he might have an early presentation of Parkinson's disease. He does not fulfill enough criteria at this point and wants him to follow back up in January. Having a tremor on  his R side, has stiffness in his elbows, drooling, and balance feels off and is either dizzy or lightheaded. And it varies from [place] to [place]. Has not had any falls. Symptoms started happening about 6-12 months ago that they noticed. Per wife Carl Nichols, she noticed some symptoms earlier. Not doing anything for exercise right now. Orthostatic hypotension depends on the [place] and it'll vary throughout the [place]. Is R  handed and having difficulty with writing and buttoning his shirts   Pt accompanied by: Wife, Carl Nichols   PERTINENT HISTORY: PMH: HLD, BPH  R arm tremors and difficulty with gait  Orthostatic hypotension   He feels symptoms started about 6 months ago and has mildly worsened. His wife notes his gait has slowed over the past 6 months.   PAIN:  Are you having pain? No  PRECAUTIONS: Other: Orthostatic hypotension    FALLS: Has patient fallen in last 6 months? 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  DIAGNOSTIC FINDINGS: MRI brain 08/13/23: IMPRESSION: Normal  COGNITION: Overall cognitive status: Within functional limits for tasks assessed   SENSATION: Light touch: WFL, but pt reporting inside of his calf felt different than the outside   COORDINATION: Heel to shin: slightly bradykinetic with RLE   OBSERVATION: RUE tremor   EDEMA:  Mild swelling to bilateral ankles   POSTURE: rounded shoulders, forward head, and increased thoracic kyphosis  LOWER EXTREMITY ROM:     Decr knee extension AROM   LOWER EXTREMITY MMT:    MMT Right Eval Left Eval  Hip flexion 4+ 5  Hip extension    Hip abduction 4 4  Hip adduction 5 5  Hip internal rotation    Hip external rotation    Knee flexion 5 5  Knee extension 5 4+  Ankle dorsiflexion    Ankle plantarflexion    Ankle inversion    Ankle eversion    (Blank rows = not tested)  All tested in sitting   BED MOBILITY:  Pt reports slower movements getting in and out of the bed   TRANSFERS: Sit to stand: Modified independence  Assistive device utilized: None     Stand to sit: Modified independence  Assistive device utilized: None      Pt with decr forward lean   GAIT: Findings: Gait Characteristics: step through pattern, decreased arm swing- Right, decreased arm swing- Left, decreased stride length, decreased trunk rotation, and trunk flexed, Distance walked: Clinic distances , Assistive device utilized:None, Level of assistance: Modified independence, and Comments: incr forward head, decr arm swing RUE>LUE  Pt reports his walking feels different, not as relaxed and comfortable   FUNCTIONAL TESTS:  5 times sit to stand: 11.1 seconds with no UE support  10 meter walk test: 9.5 seconds with no AD = 3.45 ft/sec    [facility] PT Assessment - 09/29/23 1224       Standardized Balance Assessment   Standardized Balance Assessment Timed Up and Go Test      Timed Up and Go Test   Normal TUG (seconds) 9.1    Manual TUG (seconds) 9.4    Cognitive TUG (seconds) 10.7   starting at 77, backwards by 3, no arm swing  TREATMENT DATE: 09/29/23  Self-Care: Pt reporting that he thinks he has PD, but has not been officially diagnosed and has to wait until January for current neurologist follow-up. PT provided information regarding Movement disorder specialist neurologist in the area for a  potential second opinion for diagnosis. Pt and pt's spouse appreciative of this.   Vitals:   09/29/23 1204 09/29/23 1205  BP: (!) 158/69 (!) 124/56  Pulse: (!) 56 80   Seated, Standing  Pt reporting not having any dizziness or lightheadedness.   Educated about orthostatic hypotension and what values indicates a significant drop in BP. Pt reports that he has followed up with his physician in the past about this, but it sounds like they did not really suggest anything. Discussed would be beneficially to follow-up again with PCP regarding this  Pt reporting wearing compression socks just in the winter and does not wear them in the summer due to heat, but would try to wear them as pt reports it does help his lightheadedness in the winter Educated on importance of staying hydrated and drinking water (pt reports that he is working on this), taking time with transitions, and performing seated marching or ankle pumps prior to standing    PATIENT EDUCATION: Education details: See Self-Care above, clinical findings, POC, what PT will address  Person educated: Patient and Spouse Education method: Explanation, Demonstration, and Handouts Education comprehension: verbalized understanding and returned demonstration  HOME EXERCISE PROGRAM: Will provide at future session   GOALS: Goals reviewed with patient? Yes  SHORT TERM GOALS:  ALL STGS = LTGS   LONG TERM GOALS: Target date: 10/27/2023  Pt will be independent with final HEP for strength, gait, balance, posture in order to build upon functional gains made in therapy. Baseline:  Goal status: INITIAL  2.  miniBEST goal to be assessed with LTG written.  Baseline:  Goal status: INITIAL  3.  Pt will verbalize understanding of transition to appropriate community fitness classes upon d/c from PT to maximize gains made in therapy.  Baseline:  Goal status: INITIAL    ASSESSMENT:  CLINICAL IMPRESSION: Patient is a 83 year old male referred  to Neuro OPPT for staggering gait.   Pt's PMH is significant for: HLD, BPH. Pt saw neurologist back in July and per Carl Nichols My biggest concern is that he might have an early presentation of Parkinson's disease. He does not fulfill enough criteria at this point. The following deficits were present during the exam: postural abnormalities, RUE tremor, bradykinesia, impaired balance, impaired timing/coordination of gait, decr functional strength. Pt positive for orthostatic hypotension today, but pt reporting no lightheadedness or dizziness in standing. Based on TUG vs. Cog TUG, pt is more challenged by dual tasking. Will perform further balance assessment at next session. Pt would benefit from skilled PT to address these impairments and functional limitations to maximize functional mobility independence .   OBJECTIVE IMPAIRMENTS: Abnormal gait, decreased activity tolerance, decreased balance, decreased coordination, difficulty walking, decreased ROM, decreased strength, impaired flexibility, and postural dysfunction.   ACTIVITY LIMITATIONS: bending, transfers, bed mobility, and locomotion level  PARTICIPATION LIMITATIONS: community activity  PERSONAL FACTORS: Age, Behavior pattern, Past/current experiences, Time since onset of injury/illness/exacerbation, and 1-2 comorbidities: HLD, BPH are also affecting patient's functional outcome.   REHAB POTENTIAL: Good  CLINICAL DECISION MAKING: Evolving/moderate complexity  EVALUATION COMPLEXITY: Moderate  PLAN:  PT FREQUENCY: 2x/week  PT DURATION: 6 weeks - due to delay in scheduling, anticipate just 8 PT visits for 4 weeks  PLANNED INTERVENTIONS: 97164- PT Re-evaluation, 97110-Therapeutic exercises, 97530- Therapeutic activity, 97112- Neuromuscular re-education, 97535- Self Care, 02859- Manual therapy, [phone]- Gait training, Patient/Family education, Balance training, and Stair training  PLAN FOR NEXT SESSION: perform miniBEST and write goal.  Initiate HEP for standing PWR moves, posture, and additional balance deficits found on miniBEST Work on reciprocal arm swing with gait, incr forward lean with sit <> stands  Did they make an appt with Dr. Evonnie?    [place] LOISE Senate, PT, DPT 09/29/2023, 1:08 PM

## 2023-10-01 ENCOUNTER — Encounter: Payer: Self-pay | Admitting: Neurology

## 2023-10-06 ENCOUNTER — Encounter: Payer: Self-pay | Admitting: Occupational Therapy

## 2023-10-06 ENCOUNTER — Ambulatory Visit: Admitting: Occupational Therapy

## 2023-10-06 ENCOUNTER — Ambulatory Visit: Admitting: Physical Therapy

## 2023-10-06 DIAGNOSIS — R29898 Other symptoms and signs involving the musculoskeletal system: Secondary | ICD-10-CM

## 2023-10-06 DIAGNOSIS — R29818 Other symptoms and signs involving the nervous system: Secondary | ICD-10-CM

## 2023-10-06 DIAGNOSIS — R293 Abnormal posture: Secondary | ICD-10-CM

## 2023-10-06 DIAGNOSIS — R2681 Unsteadiness on feet: Secondary | ICD-10-CM

## 2023-10-06 DIAGNOSIS — M6281 Muscle weakness (generalized): Secondary | ICD-10-CM

## 2023-10-06 DIAGNOSIS — R278 Other lack of coordination: Secondary | ICD-10-CM

## 2023-10-06 NOTE — Patient Instructions (Signed)
 SABRA

## 2023-10-06 NOTE — Therapy (Signed)
 OUTPATIENT OCCUPATIONAL THERAPY NEURO TREATMENT  Patient Name: Carl Nichols MRN: [id number] DOB:1940-10-21, 83 y.o., male Today's Date: 10/06/2023  PCP: [name] Santos, MD REFERRING PROVIDER: Avva, Ravisankar, MD  END OF SESSION:  OT End of Session - 10/06/23 9077     Visit Number 2    Number of Visits 17    Date for Recertification  11/29/23    Authorization Type [employer] MCR (therapist emailed for auth)    Authorization Time Period visit limit [place]    Progress Note Due on Visit 10    OT Start Time 0920    OT Stop Time 1005    OT Time Calculation (min) 45 min    Activity Tolerance Patient tolerated treatment well    Behavior During Therapy [facility] for tasks assessed/performed          Past Medical History:  Diagnosis Date   Dizziness    Pneumonia    march 2018   Past Surgical History:  Procedure Laterality Date   CATARACT EXTRACTION Right    jan 2013   CATARACT EXTRACTION Left    feb 2013   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   ONSET DATE: 09/07/2023  REFERRING DIAG: R29.898 (ICD-10-CM) - Other symptoms and signs involving the musculoskeletal system  THERAPY DIAG:  Other lack of coordination  Muscle weakness (generalized)  Other symptoms and signs involving the nervous system  Abnormal posture  Other symptoms and signs involving the musculoskeletal system  Unsteadiness on feet  Rationale for Evaluation and Treatment: Rehabilitation  SUBJECTIVE:   SUBJECTIVE STATEMENT: Yesterday I felt dizzy, but I just feel a little lightheaded today. No falls. No pain but weakness feeling in RT arm w/ tremors.  Pt accompanied by: self   PERTINENT HISTORY: Pt is an 83 year old man who has had progressive worsening of gait, orthostatic hypotension, and a right arm tremor. Concerns noted at most recent office visit with [facility] on 07/20/23 for Parkinson's but did not fulfill enough criteria at that time (normal muscle tone, no gait retropulsion, no definite bradykinesia). Noted less physical endurance and worsening RUE tremor when completing tasks such as toileting, holding feeding utensils, and writing. Pt was receiving SLP services at this location from 07/03/23-09/08/23 PRECAUTIONS: Fall  WEIGHT BEARING RESTRICTIONS: No  PAIN:  Are you having pain? No  FALLS: Has patient fallen in last 6 months? No  LIVING ENVIRONMENT: Lives with: lives with their spouse Lives in: [place] home, level entry, 5 STE to bedroom on left side going down and rail, den 8 on left side going down Has following equipment at home: Grab bars in walk in shower, high toilet seat  PLOF: Independentneeds some help with buttons, jackets, slower with self feeding, more difficulty recently with wiping  PATIENT GOALS: I would like to improve my handwriting and strengthen my R arm.   OBJECTIVE:  Note: Objective measures were completed at Evaluation unless otherwise noted.  HAND DOMINANCE: Right  ADLs: Overall ADLs: I/Mod I with ADLs, reported increased time with buttons and putting on jacket Transfers/ambulation related to ADLs: Eating: slower eating Grooming: I UB Dressing: difficulty/slower w/ buttons LB Dressing: I Toileting: slightly slower with wiping  Bathing: I/Mod I Tub Shower transfers: I Equipment: Grab bars and Walk in shower  IADLs: Shopping: I  Light housekeeping: I Meal Prep: spouse does cooking, pt cleans Community mobility: I Medication management: Mod I with pill organizer, no difficulty manipulating pills Financial management: I Handwriting: 95-100% legible in print, Mild micrographia, Moderate micrographia, and some spacing issues noted as the words are fairly close together. Reports writing print and micrographia getting worse with fatigue.  MOBILITY STATUS: Independent is still driving, some  stiffness in neck reported. Slower getting seatbelt on per spouse report.   POSTURE COMMENTS:  rounded shoulders and forward head Sitting balance: WFL  ACTIVITY TOLERANCE: Activity tolerance: Reports some increased fatigue, unsure if d/t old age or Parkinson's.  FUNCTIONAL OUTCOME MEASURES: Simulated eating (PPT#2) in R dominant hand: 11.70 seconds Simulated donning/doffing jacket (PPT #4): 20.85 seconds  3 button/unbutton: 27.28 seconds   UPPER EXTREMITY ROM:  WNL, some cueing required to fully extend elbows when flexing BUEs to 90 degrees.  Active ROM Right eval Left eval  Shoulder flexion    Shoulder abduction    Shoulder adduction    Shoulder extension    Shoulder internal rotation    Shoulder external rotation    Elbow flexion    Elbow extension    Wrist flexion    Wrist extension    Wrist ulnar deviation    Wrist radial deviation    Wrist pronation    Wrist supination    (Blank rows = not tested)  UPPER EXTREMITY MMT:   LUE WNL  MMT Right eval Left eval  Shoulder flexion 4   Shoulder abduction    Shoulder adduction    Shoulder extension    Shoulder internal rotation    Shoulder external rotation    Middle trapezius    Lower trapezius    Elbow flexion 4   Elbow extension    Wrist flexion    Wrist extension    Wrist ulnar deviation    Wrist radial deviation    Wrist pronation    Wrist supination    (Blank rows = not tested)  HAND FUNCTION: Grip strength: Right: 57.3 lbs; Left: 52.6 lbs  COORDINATION: 9 Hole Peg test: Right: 24.80 sec; Left: 33.96 sec Box and Blocks:  Right 49 blocks, Left 50 blocks- noted pt at times did not clear partition. Tremors: Resting and Right Simulated eating (PPT#2) in R dominant hand: 11.70 seconds Simulated donning/doffing jacket (PPT #4): 20.85 seconds  3 button/unbutton: 27.28 seconds   SENSATION: WFL  EDEMA: swelling in B ankles, PT aware.  MUSCLE TONE: RUE: slight catch, minimal cogwheel ridigity    COGNITION: Overall cognitive status: Within functional limits for tasks assessed  VISION: Subjective report: Slight changes in vision over the years.  Baseline vision: Wears glasses all the time Visual history: cataract surgery  VISION ASSESSMENT: WFL  Patient has difficulty with following activities due to following visual impairments:  PERCEPTION: Not tested  PRAXIS: Not tested  OBSERVATIONS: Concerns with handwriting noted and some slight weakness noted in RUE. Some stiffness noted as well and mild cogwheel rigidity.  TREATMENT DATE: 10/06/23   BP seated: 122/52, HR = 83  Pt issued PWR! Sitting (basic 4) and performed each x 10-20 reps w/ mod cueing for large amplitude movements.   BP dropped to 118/47 however pt reported he felt better. Pt given water to drink and then had pt lay down for 5-10 minutes. Pt instructed to wear compression socks/stocking next time he comes to therapy and encouraged pt to wear compression stockings daily.   Therapist consulted with supervision since pt drove here himself - he expressed as long as pt is asymptomatic and BP doesn't drop further, pt can drive home but would suggest him call family member to let them know he is on the way.   Pt sat back up and therapist rechecked BP for the 3rd time: 114/53, HR = 93. Pt remained asymptomatic. Therapist walked pt to car and instructed him to call his wife to let her know he was on the way and also instructed that if he becomes symptomatic to safely pull over car and call family to come get him or 911 prn. Pt verbalized understanding and agreed to do so.   (Following P.T. session cancelled d/t low BP)      PATIENT EDUCATION: Education details: Educated pt in purpose of OT and plan of tx including goals. Person educated: Patient and Spouse Education method: Explanation and  Verbal cues Education comprehension: verbalized understanding     GOALS: Goals reviewed with patient? Yes  SHORT TERM GOALS: Target date: 10/29/23 GOALS:  SHORT TERM GOALS: Target date: 10/29/23    Pt will be independent with PD specific HEP.  Baseline: not yet initiated Goal status: INITIAL  2.  Pt will verbalize understanding of adapted strategies to maximize safety and independence with ADLs/IADLs.  Baseline: not yet initiated Goal status: INITIAL  3.  Pt will write a sentence with no significant decrease in size and maintain 95% legibility and spacing.   Baseline: 95-100% legibility Goal status: INITIAL  4.  Pt will demonstrate improved fine motor coordination for ADLs as evidenced by decreasing 9 hole peg test score for L hand by at least 5 seconds.  Baseline: 33.96 seconds Goal status: INITIAL  5.  Pt will demonstrate improved ease with fastening buttons as evidenced by decreasing 3 button/unbutton time by at least 25 seconds.  Baseline: 27.85 seconds Goal status: INITIAL  6.  Pt will be able to place at least 3 or more blocks using bilateral hand with completion of Box and Blocks test.  Baseline: 49 R hand, 50 L hand blocks Goal status: INITIAL   LONG TERM GOALS: Target date: 11/29/23   Pt will verbalize understanding of ways to prevent future PD related complications and PD community resources.  Baseline: not yet initiated Goal status: INITIAL  2.  Pt will write a short paragraph with no significant decrease in size and maintain 100% legibility.  Baseline: 95-100% legibility with spacing concerns Goal status: INITIAL  3.  Pt will verbalize understanding of ways to keep thinking skills sharp and ways to compensate for STM changes in the future.  Baseline: not yet initiated Goal status: INITIAL  4.  Pt will demonstrate increased ease with dressing as evidenced by decreasing PPT#4 (don/ doff jacket) to 16 secs or less.  Baseline: 20.85  seconds Goal status: INITIAL  5. Pt will improve RUE shoulder and elbow flexion strength to 5/5 for improved ability to complete functional transfers and complete dressing tasks.   Baseline: 4/5   Goal status: INITIAL  ASSESSMENT:  CLINICAL IMPRESSION: Patient  seen today for occupational therapy treatment and initiation of HEP. Limited today d/t low BP. Hx includes dizziness, pneumonia, cataract surgery. Patient currently presents minimally below baseline level of functioning demonstrating functional deficits and impairments as noted below. Pt would benefit from continued skilled OT services in the outpatient setting to work on impairments as noted below to help pt return to PLOF as able.    PERFORMANCE DEFICITS: in functional skills including ADLs, coordination, tone, strength, Fine motor control, and tremors.   IMPAIRMENTS: are limiting patient from ADLs and IADLs.   CO-MORBIDITIES: may have co-morbidities  that affects occupational performance. Patient will benefit from skilled OT to address above impairments and improve overall function.  MODIFICATION OR ASSISTANCE TO COMPLETE EVALUATION: Min-Moderate modification of tasks or assist with assess necessary to complete an evaluation.  OT OCCUPATIONAL PROFILE AND HISTORY: Detailed assessment: Review of records and additional review of physical, cognitive, psychosocial history related to current functional performance.  CLINICAL DECISION MAKING: Moderate - several treatment options, min-mod task modification necessary  REHAB POTENTIAL: Good  EVALUATION COMPLEXITY: Moderate    PLAN:  OT FREQUENCY: 2x/week  OT DURATION: 8 weeks +eval  PLANNED INTERVENTIONS: 97168 OT Re-evaluation, 97535 self care/ADL training, 02889 therapeutic exercise, 97530 therapeutic activity, 97112 neuromuscular re-education, 97140 manual therapy, 97039 fluidotherapy, 97010 moist heat, 97760 Orthotic Initial, 97763 Orthotic/Prosthetic subsequent,  functional mobility training, visual/perceptual remediation/compensation, energy conservation, coping strategies training, patient/family education, and DME and/or AE instructions  RECOMMENDED OTHER SERVICES: none  CONSULTED AND AGREED WITH PLAN OF CARE: Patient and family member/caregiver  PLAN FOR NEXT SESSION: monitor BP, review PWR! Seated, Issue PWR! Hands and coordination HEP as able  Burnard JINNY Roads, OT 10/06/2023, 10:16 AM

## 2023-10-09 ENCOUNTER — Encounter: Payer: Self-pay | Admitting: Occupational Therapy

## 2023-10-12 ENCOUNTER — Ambulatory Visit: Admitting: Physical Therapy

## 2023-10-12 ENCOUNTER — Encounter: Payer: Self-pay | Admitting: Physical Therapy

## 2023-10-12 ENCOUNTER — Ambulatory Visit: Admitting: Occupational Therapy

## 2023-10-12 ENCOUNTER — Encounter: Payer: Self-pay | Admitting: Occupational Therapy

## 2023-10-12 VITALS — BP 96/50 | HR 89

## 2023-10-12 DIAGNOSIS — R2681 Unsteadiness on feet: Secondary | ICD-10-CM

## 2023-10-12 DIAGNOSIS — R29898 Other symptoms and signs involving the musculoskeletal system: Secondary | ICD-10-CM

## 2023-10-12 DIAGNOSIS — R293 Abnormal posture: Secondary | ICD-10-CM

## 2023-10-12 DIAGNOSIS — M6281 Muscle weakness (generalized): Secondary | ICD-10-CM

## 2023-10-12 DIAGNOSIS — R29818 Other symptoms and signs involving the nervous system: Secondary | ICD-10-CM

## 2023-10-12 DIAGNOSIS — R278 Other lack of coordination: Secondary | ICD-10-CM

## 2023-10-12 NOTE — Therapy (Signed)
 OUTPATIENT PHYSICAL THERAPY NEURO TREATMENT - ARRIVED NO CHARGE   Patient Name: Carl Nichols MRN: [id number] DOB:Sep 02, 1940, 83 y.o., male Today's Date: 10/12/2023   PCP: [name] Santos, MD  REFERRING PROVIDER: [name] Santos, MD  END OF SESSION:  PT End of Session - 10/12/23 1319     Visit Number 2   arrived no charge   Number of Visits 9    Date for Recertification  11/10/23   due to potential delay in scheduling   Authorization Type [employer] MEDICARE    PT Start Time 1318    PT Stop Time 1340   full time not used due to low BP and pt symptomatic   PT Time Calculation (min) 22 min    Equipment Utilized During Treatment --    Activity Tolerance Treatment limited secondary to medical complications (Comment)   limited by low BP   Behavior During Therapy [facility] for tasks assessed/performed          Past Medical History:  Diagnosis Date   Dizziness    Pneumonia    march 2018   Past Surgical History:  Procedure Laterality Date   CATARACT EXTRACTION Right    jan 2013   CATARACT EXTRACTION Left    feb 2013   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   ONSET DATE: 08/28/2023  REFERRING DIAG: R26.0 (ICD-10-CM) - Staggering gait  THERAPY DIAG:  Muscle weakness (generalized)  Other symptoms and signs involving the nervous system  Abnormal posture  Rationale for Evaluation and Treatment: Rehabilitation  SUBJECTIVE:                                                                                                                                                                                             SUBJECTIVE STATEMENT: Wearing compression socks today. No falls. Has an appt to see Dr. Evonnie on October 27th  Pt accompanied by: Wife, [name]   PERTINENT HISTORY: PMH: HLD, BPH  R arm tremors and difficulty with  gait  Orthostatic hypotension   He feels symptoms started about 6 months ago and has mildly worsened. His wife notes his gait has slowed over the past 6 months.   PAIN:  Are you having pain? No  PRECAUTIONS: Other: Orthostatic hypotension    FALLS: Has patient fallen in last 6 months? No  LIVING ENVIRONMENT: Lives with: lives with their spouse Lives in: House/apartment Stairs: Yes: Internal:  5 up and 8 down, lives in a tri-level steps; on right going up and External: 2 steps; on left going up Has following equipment at home: Single point cane, Grab bars, and does not use it   PLOF: Independent and Leisure: active in the church, puttering in the yard   PATIENT GOALS: wants to deal with Parkinson's thinks his R hand has gotten a little worse   OBJECTIVE:  Note: Objective measures were completed at Evaluation unless otherwise noted.  DIAGNOSTIC FINDINGS: MRI brain 08/13/23: IMPRESSION: Normal  COGNITION: Overall cognitive status: Within functional limits for tasks assessed   SENSATION: Light touch: WFL, but pt reporting inside of his calf felt different than the outside   COORDINATION: Heel to shin: slightly bradykinetic with RLE   OBSERVATION: RUE tremor   EDEMA:  Mild swelling to bilateral ankles   POSTURE: rounded shoulders, forward head, and increased thoracic kyphosis  LOWER EXTREMITY ROM:    Decr knee extension AROM   LOWER EXTREMITY MMT:    MMT Right Eval Left Eval  Hip flexion 4+ 5  Hip extension    Hip abduction 4 4  Hip adduction 5 5  Hip internal rotation    Hip external rotation    Knee flexion 5 5  Knee extension 5 4+  Ankle dorsiflexion    Ankle plantarflexion    Ankle inversion    Ankle eversion    (Blank rows = not tested)  All tested in sitting   BED MOBILITY:  Pt reports slower movements getting in and out of the bed   TRANSFERS: Sit to stand: Modified independence  Assistive device utilized: None     Stand to sit: Modified  independence  Assistive device utilized: None      Pt with decr forward lean   GAIT: Findings: Gait Characteristics: step through pattern, decreased arm swing- Right, decreased arm swing- Left, decreased stride length, decreased trunk rotation, and trunk flexed, Distance walked: Clinic distances , Assistive device utilized:None, Level of assistance: Modified independence, and Comments: incr forward head, decr arm swing RUE>LUE  Pt reports his walking feels different, not as relaxed and comfortable   FUNCTIONAL TESTS:  5 times sit to stand: 11.1 seconds with no UE support  10 meter walk test: 9.5 seconds with no AD = 3.45 ft/sec                                                                                                                                  TREATMENT DATE: 10/12/23  Therapeutic Activity:  Vitals:   10/12/23 1324 10/12/23 1325  BP: (!) 123/55 (!) 96/50  Pulse: 79 89  Seated, Standing  Pt wearing compression socks and reporting feeling lightheaded in standing. Pt reporting that he did not drink too much water today.  Had a seated rest break.   PT called pt's PCP office and made them aware of this and how it is currently affecting his ability  to safely participate in PT and that pt would benefit from a referral from a cardiologist regarding this as he has made his PCP aware and pt has not yet seen a cardiologist. PT had to leave voicemail for pt's PCP's nurse. Also educated for pt to call their office as well   Re-assess BP with pt reporting feeling lightheaded in standing with BP at : 103/53, HR: 84 bpm   Discussed since pt still symptomatic and having low BP will be an arrived no charge visit as unable to safely participate in standing activity. Educated on making sure that pt drinks plenty of water prior to PT sessions as well. Both pt and pt's spouse verbalized understanding.    PATIENT EDUCATION: Education details: See above  Person educated: Patient and  Spouse Education method: Explanation, Demonstration, and Handouts Education comprehension: verbalized understanding and returned demonstration  HOME EXERCISE PROGRAM: Will provide at future session   GOALS: Goals reviewed with patient? Yes  SHORT TERM GOALS:  ALL STGS = LTGS   LONG TERM GOALS: Target date: 10/27/2023  Pt will be independent with final HEP for strength, gait, balance, posture in order to build upon functional gains made in therapy. Baseline:  Goal status: INITIAL  2.  miniBEST goal to be assessed with LTG written.  Baseline:  Goal status: INITIAL  3.  Pt will verbalize understanding of transition to appropriate community fitness classes upon d/c from PT to maximize gains made in therapy.  Baseline:  Goal status: INITIAL    ASSESSMENT:  CLINICAL IMPRESSION:  Arrived no charge due to positive orthostatics/low BP and pt symptomatic in standing with lightheadedness even when wearing compression socks. PT called pt's PCP office and left a message regarding a referral to a cardiologist. Pt's low BP currently limits pt's ability to safely participate in PT.  .   OBJECTIVE IMPAIRMENTS: Abnormal gait, decreased activity tolerance, decreased balance, decreased coordination, difficulty walking, decreased ROM, decreased strength, impaired flexibility, and postural dysfunction.   ACTIVITY LIMITATIONS: bending, transfers, bed mobility, and locomotion level  PARTICIPATION LIMITATIONS: community activity  PERSONAL FACTORS: Age, Behavior pattern, Past/current experiences, Time since onset of injury/illness/exacerbation, and 1-2 comorbidities: HLD, BPH are also affecting patient's functional outcome.   REHAB POTENTIAL: Good  CLINICAL DECISION MAKING: Evolving/moderate complexity  EVALUATION COMPLEXITY: Moderate  PLAN:  PT FREQUENCY: 2x/week  PT DURATION: 6 weeks - due to delay in scheduling, anticipate just 8 PT visits for 4 weeks   PLANNED INTERVENTIONS: 97164-  PT Re-evaluation, 97110-Therapeutic exercises, 97530- Therapeutic activity, 97112- Neuromuscular re-education, 97535- Self Care, 02859- Manual therapy, [phone]- Gait training, Patient/Family education, Balance training, and Stair training  PLAN FOR NEXT SESSION: perform miniBEST and write goal. Initiate HEP for standing PWR moves, posture, and additional balance deficits found on miniBEST Work on reciprocal arm swing with gait, incr forward lean with sit <> stands  CHECK BP!!!! Did they hear from a referral for a cardiologist?    [place] LOISE Senate, PT, DPT 10/12/2023, 1:49 PM

## 2023-10-12 NOTE — Patient Instructions (Signed)
 PWR! Hand Exercises Perform each exercise at least 10 repetitions 1x/[place], and PWR! PUSH throughout the [place] when you are having trouble using your hands (picking up small objects, writing, eating, typing, buttoning, etc.). ** Make each movement big and deliberate; feel the movement.  PWR! UP: Fists to open fingers BIG  PWR! Rock:  Move wrists up and down [employer]! Twist: Twist palms up and down BIG  PWR! Step: Step your thumb to index finger while keeping other fingers straight. Flick fingers out BIG (thumb out/straighten fingers). Repeat with other fingers.  PWR! PUSH: Push hands out BIG. Elbows straight, wrists up, fingers open and spread apart BIG.           Coordination Exercises  Perform the following exercises for 10-15 minutes 1 times per [place]. Perform with both hand(s). Perform using big movements.  Flipping Cards: Place deck of cards on the table. Flip cards over by opening your hand big to grasp and then turn your palm up big.  Deal cards: Hold 1/2 or whole deck in your hand. Use thumb to push card off top of deck with one big push.  Rotate ball with fingertips: Pick up with fingers/thumb and move as much as you can with each turn/movement (clockwise and counter-clockwise).  Toss ball in the air and catch with the same hand: Toss big/high.  Rotate 2 golf balls in your hand: Both directions.  Pick up 5 coins one at a time and hold in palm. Then, move coins from palm to fingertips one at a time to stack.  Practice writing: Slow down, write big, and focus on forming each letter.

## 2023-10-12 NOTE — Therapy (Signed)
 OUTPATIENT OCCUPATIONAL THERAPY NEURO TREATMENT  Patient Name: Carl Nichols MRN: [id number] DOB:1940-11-18, 83 y.o., male Today's Date: 10/12/2023  PCP: [name] Santos, MD REFERRING PROVIDER: Avva, Ravisankar, MD  END OF SESSION:  OT End of Session - 10/12/23 1239     Visit Number 3    Number of Visits 17    Date for Recertification  11/29/23    Authorization Type [employer] MCR approved 8 O.T. 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   ONSET DATE: 09/07/2023  REFERRING DIAG: R29.898 (ICD-10-CM) - Other symptoms and signs involving the musculoskeletal system  THERAPY DIAG:  Other lack of coordination  Muscle weakness (generalized)  Abnormal posture  Other symptoms and signs involving the musculoskeletal system  Unsteadiness on feet  Other symptoms and signs involving the nervous system  Rationale for Evaluation and Treatment: Rehabilitation  SUBJECTIVE:   SUBJECTIVE STATEMENT: I'm wearing my compression stockings today. I still feel lightheaded. Insurance only approved 8 visits Pt accompanied by: self   PERTINENT HISTORY: Pt is an 83 year old man who has had progressive worsening of gait, orthostatic hypotension, and a right arm tremor. Concerns noted at most recent office visit with [facility] on 07/20/23 for Parkinson's but did not fulfill enough criteria at that time (normal muscle tone, no gait retropulsion, no definite bradykinesia). Noted less physical endurance and worsening RUE tremor when completing tasks such as toileting, holding feeding utensils, and writing. Pt was receiving SLP services at this location from 07/03/23-09/08/23 PRECAUTIONS: Fall  WEIGHT BEARING RESTRICTIONS: No  PAIN:  Are you having pain? No  FALLS: Has patient fallen in last 6 months? No  LIVING ENVIRONMENT: Lives with: lives with their spouse Lives in: [place] home, level entry, 5 STE to bedroom on left side going down and rail, den 8 on left side going down Has following equipment at home: Grab bars in walk in shower, high toilet seat  PLOF: Independentneeds some help with buttons, jackets, slower with self feeding, more difficulty recently with wiping  PATIENT GOALS: I would like to improve my handwriting and strengthen my R arm.   OBJECTIVE:  Note: Objective measures were completed at Evaluation unless otherwise noted.  HAND DOMINANCE: Right  ADLs: Overall ADLs: I/Mod I with ADLs, reported increased time with buttons and putting on jacket Transfers/ambulation related to ADLs: Eating: slower eating Grooming: I UB Dressing: difficulty/slower w/ buttons LB Dressing: I Toileting: slightly slower with wiping  Bathing: I/Mod I Tub Shower transfers: I Equipment: Grab bars and Walk in shower  IADLs: Shopping: I Light housekeeping: I Meal Prep: spouse does  cooking, pt cleans Community mobility: I Medication management: Mod I with pill organizer, no difficulty manipulating pills Financial management: I Handwriting: 95-100% legible in print, Mild micrographia, Moderate micrographia, and some spacing issues noted as the words are fairly close together. Reports writing print and micrographia getting worse with fatigue.  MOBILITY STATUS: Independent is still driving, some stiffness  in neck reported. Slower getting seatbelt on per spouse report.   POSTURE COMMENTS:  rounded shoulders and forward head Sitting balance: WFL  ACTIVITY TOLERANCE: Activity tolerance: Reports some increased fatigue, unsure if d/t old age or Parkinson's.  FUNCTIONAL OUTCOME MEASURES: Simulated eating (PPT#2) in R dominant hand: 11.70 seconds Simulated donning/doffing jacket (PPT #4): 20.85 seconds  3 button/unbutton: 27.28 seconds   UPPER EXTREMITY ROM:  WNL, some cueing required to fully extend elbows when flexing BUEs to 90 degrees.  Active ROM Right eval Left eval  Shoulder flexion    Shoulder abduction    Shoulder adduction    Shoulder extension    Shoulder internal rotation    Shoulder external rotation    Elbow flexion    Elbow extension    Wrist flexion    Wrist extension    Wrist ulnar deviation    Wrist radial deviation    Wrist pronation    Wrist supination    (Blank rows = not tested)  UPPER EXTREMITY MMT:   LUE WNL  MMT Right eval Left eval  Shoulder flexion 4   Shoulder abduction    Shoulder adduction    Shoulder extension    Shoulder internal rotation    Shoulder external rotation    Middle trapezius    Lower trapezius    Elbow flexion 4   Elbow extension    Wrist flexion    Wrist extension    Wrist ulnar deviation    Wrist radial deviation    Wrist pronation    Wrist supination    (Blank rows = not tested)  HAND FUNCTION: Grip strength: Right: 57.3 lbs; Left: 52.6 lbs  COORDINATION: 9 Hole Peg test: Right: 24.80 sec; Left: 33.96 sec Box and Blocks:  Right 49 blocks, Left 50 blocks- noted pt at times did not clear partition. Tremors: Resting and Right Simulated eating (PPT#2) in R dominant hand: 11.70 seconds Simulated donning/doffing jacket (PPT #4): 20.85 seconds  3 button/unbutton: 27.28 seconds   SENSATION: WFL  EDEMA: swelling in B ankles, PT aware.  MUSCLE TONE: RUE: slight catch, minimal cogwheel ridigity    COGNITION: Overall cognitive status: Within functional limits for tasks assessed  VISION: Subjective report: Slight changes in vision over the years.  Baseline vision: Wears glasses all the time Visual history: cataract surgery  VISION ASSESSMENT: WFL  Patient has difficulty with following activities due to following visual impairments:  PERCEPTION: Not tested  PRAXIS: Not tested  OBSERVATIONS: Concerns with handwriting noted and some slight weakness noted in RUE. Some stiffness noted as well and mild cogwheel rigidity.  TREATMENT DATE: 10/06/23   BP seated: 162/72, HR = 81  Reviewed PWR! Sitting (basic 4) and performed each x 5-10 reps w/ mod cueing for large amplitude movements.   Issued PWR! Hands and coordination HEP - see pt instructions prn. Pt required mod cueing   PATIENT EDUCATION: Education details: see above Person educated: Patient and Spouse Education method: Explanation, Demonstration, Verbal cues, and Handouts Education comprehension: verbalized understanding, returned demonstration, verbal cues required, and needs further education   HEP's 10/06/23: PWR! Sitting 10/12/23: PWR! Hands and coordination HEP   GOALS: Goals reviewed with patient? Yes  SHORT TERM GOALS: Target date: 10/29/23 GOALS:  SHORT TERM GOALS: Target date: 10/29/23    Pt will be independent with PD specific HEP.  Baseline: not yet initiated Goal status: INITIAL  2.  Pt will verbalize understanding of adapted strategies to maximize safety and independence with ADLs/IADLs.  Baseline: not yet initiated Goal status: INITIAL  3.  Pt will write a sentence with no significant decrease in size and maintain 95% legibility and spacing.   Baseline: 95-100% legibility Goal status: INITIAL  4.  Pt will demonstrate improved fine motor coordination for ADLs as  evidenced by decreasing 9 hole peg test score for L hand by at least 5 seconds.  Baseline: 33.96 seconds Goal status: INITIAL  5.  Pt will demonstrate improved ease with fastening buttons as evidenced by decreasing 3 button/unbutton time by at least 25 seconds.  Baseline: 27.85 seconds Goal status: INITIAL  6.  Pt will be able to place at least 3 or more blocks using bilateral hand with completion of Box and Blocks test.  Baseline: 49 R hand, 50 L hand blocks Goal status: INITIAL   LONG TERM GOALS: Target date: 11/29/23   Pt will verbalize understanding of ways to prevent future PD related complications and PD community resources.  Baseline: not yet initiated Goal status: INITIAL  2.  Pt will write a short paragraph with no significant decrease in size and maintain 100% legibility.  Baseline: 95-100% legibility with spacing concerns Goal status: INITIAL  3.  Pt will verbalize understanding of ways to keep thinking skills sharp and ways to compensate for STM changes in the future.  Baseline: not yet initiated Goal status: INITIAL  4.  Pt will demonstrate increased ease with dressing as evidenced by decreasing PPT#4 (don/ doff jacket) to 16 secs or less.  Baseline: 20.85 seconds Goal status: INITIAL  5. Pt will improve RUE shoulder and elbow flexion strength to 5/5 for improved ability to complete functional transfers and complete dressing tasks.   Baseline: 4/5   Goal status: INITIAL        ASSESSMENT:  CLINICAL IMPRESSION: Patient seen today for occupational therapy treatment for review of PWR! Sitting and initiation of HEP for hands. Hx includes dizziness, pneumonia, cataract surgery. Patient currently presents minimally below baseline level of functioning demonstrating functional deficits and impairments as noted below. Pt would benefit from continued skilled OT services in the outpatient setting to work on impairments as noted below to help pt return to PLOF as  able.    PERFORMANCE DEFICITS: in functional skills including ADLs, coordination, tone, strength, Fine motor control, and tremors.   IMPAIRMENTS: are limiting patient from ADLs and IADLs.   CO-MORBIDITIES: may have co-morbidities  that affects occupational performance. Patient will benefit from skilled OT to address above impairments and improve overall function.  MODIFICATION OR ASSISTANCE TO COMPLETE EVALUATION: Min-Moderate modification of tasks or assist with assess necessary to  complete an evaluation.  OT OCCUPATIONAL PROFILE AND HISTORY: Detailed assessment: Review of records and additional review of physical, cognitive, psychosocial history related to current functional performance.  CLINICAL DECISION MAKING: Moderate - several treatment options, min-mod task modification necessary  REHAB POTENTIAL: Good  EVALUATION COMPLEXITY: Moderate    PLAN:  OT FREQUENCY: 2x/week  OT DURATION: 8 weeks +eval  PLANNED INTERVENTIONS: 97168 OT Re-evaluation, 97535 self care/ADL training, 02889 therapeutic exercise, 97530 therapeutic activity, 97112 neuromuscular re-education, 97140 manual therapy, 97039 fluidotherapy, 97010 moist heat, 97760 Orthotic Initial, 97763 Orthotic/Prosthetic subsequent, functional mobility training, visual/perceptual remediation/compensation, energy conservation, coping strategies training, patient/family education, and DME and/or AE instructions  RECOMMENDED OTHER SERVICES: none  CONSULTED AND AGREED WITH PLAN OF CARE: Patient and family member/caregiver  PLAN FOR NEXT SESSION: monitor BP, review PWR! Hands and coordination HEP as able, progress towards remaining STG's  Burnard JINNY Roads, OT 10/12/2023, 12:42 PM

## 2023-10-14 ENCOUNTER — Ambulatory Visit: Attending: Internal Medicine | Admitting: Occupational Therapy

## 2023-10-14 ENCOUNTER — Encounter: Payer: Self-pay | Admitting: Physical Therapy

## 2023-10-14 ENCOUNTER — Ambulatory Visit: Admitting: Physical Therapy

## 2023-10-14 VITALS — BP 135/47 | HR 79

## 2023-10-14 DIAGNOSIS — R29818 Other symptoms and signs involving the nervous system: Secondary | ICD-10-CM | POA: Diagnosis present

## 2023-10-14 DIAGNOSIS — R278 Other lack of coordination: Secondary | ICD-10-CM | POA: Diagnosis present

## 2023-10-14 DIAGNOSIS — R2681 Unsteadiness on feet: Secondary | ICD-10-CM

## 2023-10-14 DIAGNOSIS — R293 Abnormal posture: Secondary | ICD-10-CM

## 2023-10-14 DIAGNOSIS — R29898 Other symptoms and signs involving the musculoskeletal system: Secondary | ICD-10-CM | POA: Insufficient documentation

## 2023-10-14 DIAGNOSIS — M6281 Muscle weakness (generalized): Secondary | ICD-10-CM | POA: Insufficient documentation

## 2023-10-14 DIAGNOSIS — R2689 Other abnormalities of gait and mobility: Secondary | ICD-10-CM | POA: Insufficient documentation

## 2023-10-14 NOTE — Therapy (Signed)
 OUTPATIENT PHYSICAL THERAPY NEURO TREATMENT - ARRIVED NO CHARGE   Patient Name: Carl Nichols MRN: [id number] DOB:December 25, 1940, 83 y.o., male Today's Date: 10/14/2023   PCP: [name] Santos, MD  REFERRING PROVIDER: [name] Santos, MD  END OF SESSION:  PT End of Session - 10/14/23 1237     Visit Number 2   arrived no charge   Number of Visits 9    Date for Recertification  11/10/23   due to potential delay in scheduling   Authorization Type [employer]    PT Start Time [phone]   handoff from OT, pt in restroom   PT Stop Time 1255   full time not used due to low BP   PT Time Calculation (min) 18 min    Activity Tolerance Treatment limited secondary to medical complications (Comment)   limited by low BP   Behavior During Therapy [facility] for tasks assessed/performed           Past Medical History:  Diagnosis Date   Dizziness    Pneumonia    march 2018   Past Surgical History:  Procedure Laterality Date   CATARACT EXTRACTION Right    jan 2013   CATARACT EXTRACTION Left    feb 2013   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   ONSET DATE: 08/28/2023  REFERRING DIAG: R26.0 (ICD-10-CM) - Staggering gait  THERAPY DIAG:  Abnormal posture  Other symptoms and signs involving the nervous system  Unsteadiness on feet  Rationale for Evaluation and Treatment: Rehabilitation  SUBJECTIVE:                                                                                                                                                                                             SUBJECTIVE STATEMENT: Pt and pt's spouse also called PCP and left a message regarding low BP. Pt's PCP is out of the office, so MD on call told him to continue drinking water and wearing his compression socks. Trying to drink more water and no  lightheadedness today.   Pt accompanied by: Wife, [name]   PERTINENT HISTORY: PMH: HLD, BPH  R arm tremors and difficulty with gait  Orthostatic hypotension   He feels symptoms started about 6 months ago and has mildly worsened. His wife notes his gait has slowed over the past 6 months.   PAIN:  Are you having pain? No  PRECAUTIONS: Other: Orthostatic hypotension  FALLS: Has patient fallen in last 6 months? 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  DIAGNOSTIC FINDINGS: MRI brain 08/13/23: IMPRESSION: Normal  COGNITION: Overall cognitive status: Within functional limits for tasks assessed   SENSATION: Light touch: WFL, but pt reporting inside of his calf felt different than the outside   COORDINATION: Heel to shin: slightly bradykinetic with RLE   OBSERVATION: RUE tremor   EDEMA:  Mild swelling to bilateral ankles   POSTURE: rounded shoulders, forward head, and increased thoracic kyphosis  LOWER EXTREMITY ROM:    Decr knee extension AROM   LOWER EXTREMITY MMT:    MMT Right Eval Left Eval  Hip flexion 4+ 5  Hip extension    Hip abduction 4 4  Hip adduction 5 5  Hip internal rotation    Hip external rotation    Knee flexion 5 5  Knee extension 5 4+  Ankle dorsiflexion    Ankle plantarflexion    Ankle inversion    Ankle eversion    (Blank rows = not tested)  All tested in sitting   BED MOBILITY:  Pt reports slower movements getting in  and out of the bed   TRANSFERS: Sit to stand: Modified independence  Assistive device utilized: None     Stand to sit: Modified independence  Assistive device utilized: None      Pt with decr forward lean   GAIT: Findings: Gait Characteristics: step through pattern, decreased arm swing- Right, decreased arm swing- Left, decreased stride length, decreased trunk rotation, and trunk flexed, Distance walked: Clinic distances , Assistive device utilized:None, Level of assistance: Modified independence, and Comments: incr forward head, decr arm swing RUE>LUE  Pt reports his walking feels different, not as relaxed and comfortable   FUNCTIONAL TESTS:  5 times sit to stand: 11.1 seconds with no UE support  10 meter walk test: 9.5 seconds with no AD = 3.45 ft/sec                                                                                                                                  TREATMENT DATE: 10/1/125  Therapeutic Activity:  Vitals:   10/14/23 1239 10/14/23 1241  BP: (!) 163/64 (!) 135/47  Pulse: (!) 59 79   Seated, Standing  Pt wearing compression socks, drinking water, and reports no lightheadedness or dizziness in standing.   Discussed that since pt is still  having low BP and orthostatic values in standing below safe therapeutic range to participate in PT, will go on hold until pt follows up with PCP and gets referral to cardiology and until pt sees Dr. Evonnie, as PT unable to safely work on standing activity at this time. Pt to also go on hold with OT at this time as pt has a drive to get to the clinic. Pt and pt's spouse in agreement. Added appts for PT in November after pt follows up with physicians and sees Dr. Evonnie   PATIENT EDUCATION: Education details: See above  Person educated: Patient and Spouse Education method: Explanation, Demonstration, and Handouts Education comprehension: verbalized understanding and returned demonstration  HOME EXERCISE PROGRAM: Will  provide at future session   GOALS: Goals reviewed with patient? Yes  SHORT TERM GOALS:  ALL STGS = LTGS   LONG TERM GOALS: Target date: 10/27/2023  Pt will be independent with final HEP for strength, gait, balance, posture in order to build upon functional gains made in therapy. Baseline:  Goal status: INITIAL  2.  miniBEST goal to be assessed with LTG written.  Baseline:  Goal status: INITIAL  3.  Pt will verbalize understanding of transition to appropriate community fitness classes upon d/c from PT to maximize gains made in therapy.  Baseline:  Goal status: INITIAL    ASSESSMENT:  CLINICAL IMPRESSION:  Arrived no charge - see above.  .   OBJECTIVE IMPAIRMENTS: Abnormal gait, decreased activity tolerance, decreased balance, decreased coordination, difficulty walking, decreased ROM, decreased strength, impaired flexibility, and postural dysfunction.   ACTIVITY LIMITATIONS: bending, transfers, bed mobility, and locomotion level  PARTICIPATION LIMITATIONS: community activity  PERSONAL FACTORS: Age, Behavior pattern, Past/current experiences, Time since onset of injury/illness/exacerbation, and 1-2 comorbidities: HLD, BPH are also affecting patient's functional outcome.   REHAB POTENTIAL: Good  CLINICAL DECISION MAKING: Evolving/moderate complexity  EVALUATION COMPLEXITY: Moderate  PLAN:  PT FREQUENCY: 2x/week  PT DURATION: 6 weeks - due to delay in scheduling, anticipate just 8 PT visits for 4 weeks   PLANNED INTERVENTIONS: 97164- PT Re-evaluation, 97110-Therapeutic exercises, 97530- Therapeutic activity, 97112- Neuromuscular re-education, 97535- Self Care, 02859- Manual therapy, [phone]- Gait training, Patient/Family education, Balance training, and Stair training  PLAN FOR NEXT SESSION: perform miniBEST and write goal. Initiate HEP for standing PWR moves, posture, and additional balance deficits found on miniBEST Work on reciprocal arm swing with gait, incr forward  lean with sit <> stands  CHECK BP!!!! Did pt see cardiologist and how did appt with Dr. Evonnie go today?    [place] LOISE Senate, PT, DPT 10/14/2023, 1:07 PM

## 2023-10-14 NOTE — Patient Instructions (Signed)
 Suggestions for Handwriting Changes Many people with Parkinson's notice changes in their handwriting.  Handwriting often becomes small and cramped, and can become more difficult to control when writing for longer periods of time.  This handwriting change is called micrographia.  Why does micrographia occur?  Parkinson's can cause slowing of movement, and feelings of muscle stiffness in the hands and fingers.  Loss of automatic motion also affects the easy, flowing motion of handwriting.  This can impact even simple writing tasks such as signing your name or writing a shopping list.  Attempts to write quickly without thinking about forming each letter contributes to small, cramped handwriting, and may cause the hand to develop a feeling of tightness.  How can I make writing easier?  Make a deliberate effort to form each letter.  This can be hard to do at first, but is very effective in improving size and legibility of handwriting.  Use a pen grip (round or triangular shaped rubber or foam cylinders available at stationery stores or where writing materials are found) or a larger size pen to keep your hand more relaxed.  Try printing rather than writing in a cursive style. Printing causes you to pause briefly between each letter, keeping writing more legible.   Using lined paper may provide a visual target to keep all letters big when writing.   A ballpoint pen typically works better than felt tip or rolling writer/gel styles.  Rest your hand if it begins to feel tight.  Pause briefly when you see your handwriting becoming smaller.  Avoid hurrying or trying to write long passages if you are feeling stressed or fatigued.  Practice helps!  Remind yourself to slow down, aim big, and pause often!  Perform flicks/PWR! Hands if your hand feels tight, your writing gets smaller, before you start writing, or if tremors increase.  ** May benefit from practicing writing on graph paper to help  with spacing  Involving your team: An occupational therapist can provide assessment and individual recommendations for improvement of your handwriting.  This handout was adapted from [email or web address] [employer]

## 2023-10-14 NOTE — Therapy (Signed)
 OUTPATIENT OCCUPATIONAL THERAPY NEURO TREATMENT  Patient Name: MARKEE MATERA MRN: [id number] DOB:12-11-1940, 83 y.o., male Today's Date: 10/14/2023  PCP: [name] Santos, MD REFERRING PROVIDER: [name] Santos, MD  END OF SESSION:  OT End of Session - 10/14/23 1156     Visit Number 4    Number of Visits 17    Date for Recertification  11/29/23    Authorization Type [employer] MCR approved 8 O.T. 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   ONSET DATE: 09/07/2023  REFERRING DIAG: R29.898 (ICD-10-CM) - Other symptoms and signs involving the musculoskeletal system  THERAPY DIAG:  Other lack of coordination  Muscle weakness (generalized)  Abnormal posture  Other symptoms and signs involving the musculoskeletal system  Other symptoms and signs involving the nervous system  Unsteadiness on feet  Rationale for Evaluation and Treatment: Rehabilitation  SUBJECTIVE:   SUBJECTIVE STATEMENT: I'm wearing my compression socks again today. I feel better and my BP has been better yesterday and today Pt accompanied by: self   PERTINENT HISTORY: Pt is an 83 year old man who has had progressive worsening of gait, orthostatic hypotension, and a right arm tremor. Concerns noted at most recent office visit with [facility] on 07/20/23 for Parkinson's but did not fulfill enough criteria at that time (normal muscle tone, no gait retropulsion, no definite bradykinesia). Noted less physical endurance and worsening RUE tremor when completing tasks such as toileting, holding feeding utensils, and writing. Pt was receiving SLP services at this location from 07/03/23-09/08/23 PRECAUTIONS: Fall  WEIGHT BEARING RESTRICTIONS: No  PAIN:  Are you having pain? No  FALLS: Has patient fallen in last 6 months? No  LIVING ENVIRONMENT: Lives with: lives with their spouse Lives in: [place] home, level entry, 5 STE to bedroom on left side going down and rail, den 8 on left side going down Has following equipment at home: Grab bars in walk in shower, high toilet seat  PLOF: Independentneeds some help with buttons, jackets, slower with self feeding, more difficulty recently with wiping  PATIENT GOALS: I would like to improve my handwriting and strengthen my R arm.   OBJECTIVE:  Note: Objective measures were completed at Evaluation unless otherwise noted.  HAND DOMINANCE: Right  ADLs: Overall ADLs: I/Mod I with ADLs, reported increased time with buttons and putting on jacket Transfers/ambulation related to ADLs: Eating: slower eating Grooming: I UB Dressing: difficulty/slower w/ buttons LB Dressing: I Toileting: slightly slower with wiping  Bathing: I/Mod I Tub Shower transfers: I Equipment: Grab bars and Walk in shower  IADLs: Shopping: I Light housekeeping: I  Meal Prep: spouse does cooking, pt cleans Community mobility: I Medication management: Mod I with pill organizer, no difficulty manipulating pills Financial management: I Handwriting: 95-100% legible in print, Mild micrographia, Moderate micrographia, and some spacing issues noted as the words are fairly close together. Reports writing print and micrographia getting worse with fatigue.  MOBILITY STATUS: Independent is still driving, some stiffness  in neck reported. Slower getting seatbelt on per spouse report.   POSTURE COMMENTS:  rounded shoulders and forward head Sitting balance: WFL  ACTIVITY TOLERANCE: Activity tolerance: Reports some increased fatigue, unsure if d/t old age or Parkinson's.  FUNCTIONAL OUTCOME MEASURES: Simulated eating (PPT#2) in R dominant hand: 11.70 seconds Simulated donning/doffing jacket (PPT #4): 20.85 seconds  3 button/unbutton: 27.28 seconds   UPPER EXTREMITY ROM:  WNL, some cueing required to fully extend elbows when flexing BUEs to 90 degrees.  Active ROM Right eval Left eval  Shoulder flexion    Shoulder abduction    Shoulder adduction    Shoulder extension    Shoulder internal rotation    Shoulder external rotation    Elbow flexion    Elbow extension    Wrist flexion    Wrist extension    Wrist ulnar deviation    Wrist radial deviation    Wrist pronation    Wrist supination    (Blank rows = not tested)  UPPER EXTREMITY MMT:   LUE WNL  MMT Right eval Left eval  Shoulder flexion 4   Shoulder abduction    Shoulder adduction    Shoulder extension    Shoulder internal rotation    Shoulder external rotation    Middle trapezius    Lower trapezius    Elbow flexion 4   Elbow extension    Wrist flexion    Wrist extension    Wrist ulnar deviation    Wrist radial deviation    Wrist pronation    Wrist supination    (Blank rows = not tested)  HAND FUNCTION: Grip strength: Right: 57.3 lbs; Left: 52.6 lbs  COORDINATION: 9 Hole Peg test: Right: 24.80 sec; Left: 33.96 sec Box and Blocks:  Right 49 blocks, Left 50 blocks- noted pt at times did not clear partition. Tremors: Resting and Right Simulated eating (PPT#2) in R dominant hand: 11.70 seconds Simulated donning/doffing jacket (PPT #4): 20.85 seconds  3 button/unbutton: 27.28 seconds   SENSATION: WFL  EDEMA: swelling in B ankles, PT aware.  MUSCLE TONE: RUE: slight catch, minimal cogwheel ridigity    COGNITION: Overall cognitive status: Within functional limits for tasks assessed  VISION: Subjective report: Slight changes in vision over the years.  Baseline vision: Wears glasses all the time Visual history: cataract surgery  VISION ASSESSMENT: WFL  Patient has difficulty with following activities due to following visual impairments:  PERCEPTION: Not tested  PRAXIS: Not tested  OBSERVATIONS: Concerns with handwriting noted and some slight weakness noted in RUE. Some stiffness noted as well and mild cogwheel rigidity.  TREATMENT DATE: 10/14/23  BP seated: 145/56, HR = 60  Reviewed PWR! Hands and coordination HEP - Pt doing better with coordination activities Rt hand  Issued handwriting strategies and reviewed and practiced handwriting - pt able to write 3 sentences in print maintaining legibility, size, and spacing w/ min cues   PATIENT EDUCATION: Education details: see above Person educated: Patient and Spouse Education method: Explanation, Demonstration, Verbal cues, and Handouts Education comprehension: verbalized understanding, returned demonstration, verbal cues required, and needs further education   HEP's 10/06/23: PWR! Sitting 10/12/23: PWR! Hands and coordination HEP  10/23/23: Handwriting strategies  GOALS: Goals reviewed with patient? Yes  SHORT TERM GOALS: Target date: 10/29/23 GOALS:  SHORT TERM GOALS: Target date: 10/29/23    Pt will be independent with PD specific HEP.  Baseline: not yet initiated Goal status: IN PROGRESS  2.  Pt will verbalize understanding of adapted strategies to maximize safety and independence with ADLs/IADLs.  Baseline: not yet initiated Goal status: INITIAL  3.  Pt will write a sentence with no significant decrease in size and maintain 95% legibility and spacing.   Baseline: 95-100%  legibility Goal status: MET  4.  Pt will demonstrate improved fine motor coordination for ADLs as evidenced by decreasing 9 hole peg test score for L hand by at least 5 seconds.  Baseline: 33.96 seconds Goal status: IN PROGRESS  5.  Pt will demonstrate improved ease with fastening buttons as evidenced by decreasing 3 button/unbutton time by at least 25 seconds.  Baseline: 27.85 seconds Goal status: INITIAL  6.  Pt will be able to place at least 3 or more blocks using bilateral hand with completion of Box and Blocks test.  Baseline: 49 R hand, 50 L hand blocks Goal status: INITIAL   LONG TERM GOALS: Target date: 11/29/23   Pt will verbalize understanding of ways to prevent future PD related complications and PD community resources.  Baseline: not yet initiated Goal status: INITIAL  2.  Pt will write a short paragraph with no significant decrease in size and maintain 100% legibility.  Baseline: 95-100% legibility with spacing concerns Goal status: INITIAL  3.  Pt will verbalize understanding of ways to keep thinking skills sharp and ways to compensate for STM changes in the future.  Baseline: not yet initiated Goal status: INITIAL  4.  Pt will demonstrate increased ease with dressing as evidenced by decreasing PPT#4 (don/ doff jacket) to 16 secs or less.  Baseline: 20.85 seconds Goal status: INITIAL  5. Pt will improve RUE shoulder and elbow flexion strength to 5/5 for improved ability to complete functional transfers and complete dressing tasks.   Baseline: 4/5   Goal status: INITIAL        ASSESSMENT:  CLINICAL IMPRESSION: Patient seen today for occupational therapy treatment for review of PWR! Hands and coordination HEP. Pt already demo improvements in [facility]. Pt limited by low BP. Hx includes dizziness, pneumonia, cataract surgery. Patient currently presents minimally below baseline level of functioning demonstrating functional deficits and impairments as noted  below. Pt would benefit from continued skilled OT services in the outpatient setting to work on impairments as noted below to help pt return to PLOF as able.    PERFORMANCE DEFICITS: in functional skills including ADLs, coordination, tone, strength, Fine motor control, and tremors.   IMPAIRMENTS: are limiting patient from ADLs and IADLs.   CO-MORBIDITIES: may have co-morbidities  that affects occupational performance. Patient will benefit from skilled OT to address above impairments and improve overall  function.  MODIFICATION OR ASSISTANCE TO COMPLETE EVALUATION: Min-Moderate modification of tasks or assist with assess necessary to complete an evaluation.  OT OCCUPATIONAL PROFILE AND HISTORY: Detailed assessment: Review of records and additional review of physical, cognitive, psychosocial history related to current functional performance.  CLINICAL DECISION MAKING: Moderate - several treatment options, min-mod task modification necessary  REHAB POTENTIAL: Good  EVALUATION COMPLEXITY: Moderate    PLAN:  OT FREQUENCY: 2x/week  OT DURATION: 8 weeks +eval  PLANNED INTERVENTIONS: 97168 OT Re-evaluation, 97535 self care/ADL training, 02889 therapeutic exercise, 97530 therapeutic activity, 97112 neuromuscular re-education, 97140 manual therapy, 97039 fluidotherapy, 97010 moist heat, 97760 Orthotic Initial, 97763 Orthotic/Prosthetic subsequent, functional mobility training, visual/perceptual remediation/compensation, energy conservation, coping strategies training, patient/family education, and DME and/or AE instructions  RECOMMENDED OTHER SERVICES: none  CONSULTED AND AGREED WITH PLAN OF CARE: Patient and family member/caregiver  PLAN FOR NEXT SESSION: place on hold until MD can further medically manage BP as BP has been consistently low, and much lower in standing. Pt drives a good distance to attend therapy and has been unable to participate in P.T. sessions d/t orthostatic hypotension -  pt would benefit from further medical management to progress O.T. as well.   Burnard JINNY Roads, OT 10/14/2023, 11:56 AM

## 2023-10-19 ENCOUNTER — Ambulatory Visit: Admitting: Physical Therapy

## 2023-10-19 ENCOUNTER — Ambulatory Visit: Admitting: Occupational Therapy

## 2023-10-20 ENCOUNTER — Ambulatory Visit: Attending: Cardiovascular Disease | Admitting: Cardiovascular Disease

## 2023-10-20 ENCOUNTER — Encounter: Payer: Self-pay | Admitting: Cardiovascular Disease

## 2023-10-20 VITALS — BP 161/71 | HR 65 | Ht 71.0 in | Wt 157.4 lb

## 2023-10-20 DIAGNOSIS — R0602 Shortness of breath: Secondary | ICD-10-CM | POA: Diagnosis not present

## 2023-10-20 DIAGNOSIS — I951 Orthostatic hypotension: Secondary | ICD-10-CM

## 2023-10-20 MED ORDER — MIDODRINE HCL 2.5 MG PO TABS: 2.5000 mg | ORAL_TABLET | Freq: Three times a day (TID) | ORAL | 2 refills | Status: DC

## 2023-10-20 NOTE — Patient Instructions (Addendum)
 Medication Instructions:  START Midodrine 2.5 mg three times daily  *If you need a refill on your cardiac medications before your next appointment, please call your pharmacy*  Lab Work: None ordered If you have labs (blood work) drawn today and your tests are completely normal, you will receive your results only by: MyChart Message (if you have MyChart) OR A paper copy in the mail If you have any lab test that is abnormal or we need to change your treatment, we will call you to review the results.  Testing/Procedures:Your physician has requested that you have an echocardiogram. Echocardiography is a painless test that uses sound waves to create images of your heart. It provides your doctor with information about the size and shape of your heart and how well your heart's chambers and valves are working.   You may receive an ultrasound enhancing agent through an IV if needed to better visualize your heart during the echo. This procedure takes approximately one hour.  There are no restrictions for this procedure.  This will take place at [address] [facility] [address], [place] [address]  Please note: We ask at that you not bring children with you during ultrasound (echo/ vascular) testing. Due to room size and safety concerns, children are not allowed in the ultrasound rooms during exams. Our front office staff cannot provide observation of children in our lobby area while testing is being conducted. An adult accompanying a patient to their appointment will only be allowed in the ultrasound room at the discretion of the ultrasound technician under special circumstances. We apologize for any inconvenience.   Follow-Up: At [facility], you and your health needs are our priority.  As part of our continuing mission to provide you with exceptional heart care, our providers are all part of one team.  This team includes your primary Cardiologist (physician) and Advanced  Practice Providers or APPs (Physician Assistants and Nurse Practitioners) who all work together to provide you with the care you need, when you need it.  Your next appointment:   6 week(s)  Provider:   You may see Dr. Darron or one of the following Advanced Practice Providers on your designated Care Team:   Lonni Meager, NP Lesley Maffucci, PA-C Bernardino Bring, PA-C Cadence [place], PA-C Tylene Lunch, NP Barnie Hila, NP    We recommend signing up for the patient portal called MyChart.  Sign up information is provided on this After Visit Summary.  MyChart is used to connect with patients for Virtual Visits (Telemedicine).  Patients are able to view lab/test results, encounter notes, upcoming appointments, etc.  Non-urgent messages can be sent to your provider as well.   To learn more about what you can do with MyChart, go to [email or web address].   Other Instructions Dr. Darron recommends THIGH HIGH COMPRESSION STOCKINGS and an ABDOMINAL BINDER TO [facility] DURING THE [place]         -- [facility]             -- [address] [place]             -- [phone]  -- [facility]             -- [address] [place]             -- [phone]

## 2023-10-20 NOTE — Progress Notes (Unsigned)
 Cardiology Office Note   Date:  10/20/2023   ID:  Murtaza Shell Shorty, DOB 1940-09-25, MRN [id number]  PCP:  [name] Santos, MD  Cardiologist:  PIERRETTE Deatrice Cage, MD   Chief Complaint  Patient presents with   New Patient (Initial Visit)    Orthostatic Hypotension c/o fluctuating BP and dizziness. Meds reviewed verbally with pt.      History of Present Illness: Carl Nichols is a 83 y.o. 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     Probiotic Product (PROBIOTIC PO) daily.     psyllium (METAMUCIL) 58.6 % packet Take 1 packet by mouth daily.     acetaminophen (TYLENOL) 500 MG tablet Take 500 mg by mouth every 4 (four) hours as needed.     alfuzosin (UROXATRAL) 10 MG 24 hr tablet TAKE 1 TABLET BY MOUTH TWICE DAILY 180 tablet 3   carboxymethylcellul-glycerin (OPTIVE) 0.5-0.9 % ophthalmic solution Place 1 drop into both eyes as needed.     chlorhexidine (PERIDEX) 0.12 % solution Use as directed 5 mLs in the mouth or throat 2 (two) times daily. As needed (Patient not taking: Reported on 10/20/2023)     ezetimibe (ZETIA) 10 MG tablet Take 10 mg by mouth daily.     finasteride (PROSCAR) 5 MG tablet TAKE 1 TABLET BY MOUTH EVERY Ruble 90 tablet 3   loratadine (CLARITIN) 10 MG tablet Take 10 mg by mouth daily as needed.     No current facility-administered medications for this visit.    Allergies:   Rosuvastatin and Penicillin g    Social History:  The patient  reports that he  has never smoked. He has never used smokeless tobacco. He reports that he does not currently use alcohol. He reports that he does not use drugs.   Family History:  The patient's ***family history is not on file.    ROS:  Please see the history of present illness.   Otherwise, review of systems are positive for {NONE DEFAULTED:18576}.   All other systems are reviewed and negative.    PHYSICAL EXAM: VS:  BP (!) 161/71 (BP Location: Left Arm, Patient Position: Sitting, Cuff Size: Normal)   Pulse 65   Ht 5' 11 (1.803 m)   Wt 157 lb 6 oz (71.4 kg)   SpO2 98%   BMI 21.95 kg/m  , BMI Body mass index is 21.95 kg/m. GEN: Well nourished, well developed, in no acute distress  HEENT: normal  Neck: no JVD, carotid bruits, or masses Cardiac: ***RRR; no murmurs, rubs, or gallops,no edema  Respiratory:  clear to auscultation bilaterally, normal work of breathing GI: soft, nontender, nondistended, + BS MS: no deformity or atrophy  Skin: warm and dry, no rash Neuro:  Strength and sensation are intact Psych: euthymic mood, full affect   EKG:  EKG is ordered today. The ekg ordered today demonstrates : Normal sinus rhythm with sinus arrhythmia Normal ECG       Recent Labs: No results found for requested labs within last 365 days.    Lipid Panel No results found for: CHOL, TRIG, HDL, CHOLHDL, VLDL, LDLCALC, LDLDIRECT    Wt Readings from Last 3 Encounters:  10/20/23 157 lb 6 oz (71.4 kg)  07/20/23 158 lb 8 oz (71.9 kg)      Other studies Reviewed: Additional studies/ records that were reviewed today include: ***. Review of the above records demonstrates: ***     10/20/2023    2:18 PM  PAD Screen  Previous PAD dx? No  Previous surgical procedure? No  Pain with walking? No  Feet/toe relief with dangling? No  Painful, non-healing ulcers? No  Extremities discolored? No      ASSESSMENT AND PLAN:  1.  ***    Disposition:   FU with *** in {gen number  9-89:[id number]} {Days to years:10300}  Signed,  Deatrice Cage, MD  10/20/2023 2:47 PM    [facility]

## 2023-10-21 ENCOUNTER — Encounter: Admitting: Occupational Therapy

## 2023-10-21 ENCOUNTER — Ambulatory Visit: Admitting: Physical Therapy

## 2023-10-23 ENCOUNTER — Encounter: Payer: Self-pay | Admitting: Cardiovascular Disease

## 2023-10-26 ENCOUNTER — Encounter: Payer: Self-pay | Admitting: Cardiovascular Disease

## 2023-10-27 ENCOUNTER — Ambulatory Visit: Admitting: Physical Therapy

## 2023-10-27 ENCOUNTER — Encounter: Admitting: Occupational Therapy

## 2023-10-29 ENCOUNTER — Ambulatory Visit: Admitting: Physical Therapy

## 2023-10-29 ENCOUNTER — Encounter: Admitting: Occupational Therapy

## 2023-11-03 ENCOUNTER — Encounter: Admitting: Occupational Therapy

## 2023-11-04 NOTE — Progress Notes (Signed)
 Assessment/Plan:   1.  Parkinsonism.  I suspect that this does represent idiopathic Parkinson's disease.  The patient has tremor, bradykinesia, rigidity and mild postural instability.  -We discussed the diagnosis as well as pathophysiology of the disease.  We discussed treatment options as well as prognostic indicators.  Patient education was provided.  -We discussed that it used to be thought that levodopa would increase risk of melanoma but now it is believed that Parkinsons itself likely increases risk of melanoma. he is to get regular skin checks.  He does this with Dr. Jordan at [facility] dermatology  -We decided to add carbidopa/levodopa 25/100.  1/2 tab tid x 1 wk, then 1/2 in am & noon & 1 at night for a week, then 1/2 in am &1 at noon &night for a week, then 1 po tid at 7 AM/11 AM/4 PM.  Risks, benefits, side effects and alternative therapies were discussed.  The opportunity to ask questions was given and they were answered to the best of my ability.  The patient expressed understanding and willingness to follow the outlined treatment protocols.  -he's getting ready to restart PT. -patient support groups and community exercise programs for PD and pt education was provided to the patient.   2.  Dizziness  -now on midodrine, 2.5 mg tid.  We discussed salt liberalization, which was also noted in cardiology notes.  3.  CKD 3  - Following with primary care  4.  Patient has a follow-up with [facility] neurology in January.  I told him I certainly have no objection if he would like to follow-up there.  If he wants to follow-up here, he can see me in the next 6 months.  He was going to think about where he wanted to follow-up and make an appointment.  I told him he certainly does not need both neurology groups, however.  Subjective:   Carl Nichols was seen today in the movement disorders clinic for neurologic consultation at the request of Avva, Ravisankar, MD.  This patient is accompanied in the  office by his spouse who supplements the history. The consultation is for the evaluation of tremor, gait abnormality and to rule out Parkinson's.  Patient recently saw [facility] neurology for the same.  I have reviewed Dr. Duncan records.  Patient was initially referred to [facility] neurology by primary care physician for right arm tremor and gait trouble.  He saw Dr. Vear on July 20, 2023.  Symptoms started 6 months prior.  Dr. Vear noted right upper extremity rest tremor, but felt that he did not meet formal criteria for Parkinsons disease.  He recommended an MRI of the brain and some lab work and told him to follow up in 6 months.  MRI of the brain was unremarkable.  His B12 was normal at 937.  He followed up with his primary care physician and a second opinion was requested, for which he is here today.   Specific Symptoms:  Tremor: Yes.  , RUE tremor x 1 year, rest.  No R leg tremor; no tremor on the L.  Tremor is intermittent.   Family hx of similar:  No. Voice: softer - did ST and it did help and he is still doing the exercises Sleep: sleeps well  Vivid Dreams:  No.  Acting out dreams:  No., with the exception of some conversations Wet Pillows: Yes.   Postural symptoms:  Yes.  , but its all related to the lightheadedness, better over the last  month  Falls?  No. Bradykinesia symptoms: wife doesn't note shuffling but she notes rigidness in elbows with walking; drooling while awake Loss of smell:  Yes.   X years Loss of taste:  Yes.   Urinary Incontinence:  occ leakage but doesn't need to wear undergarments Difficulty Swallowing:  No. Handwriting, micrographia: Yes.   Trouble with ADL's:  No.  Trouble buttoning clothing: Yes.   Depression:  No. Memory changes:  Yes.  ; has trouble with names/events but does okay with medications and paying bills; does well with driving Hallucinations:  No.  visual distortions: No. N/V:  No. Lightheaded:  Yes.  , started on midodrine 1 month ago and it  helped.    Syncope: No. Diplopia:  No. Dyskinesia:  No. Prior exposure to reglan/antipsychotics: No.  PREVIOUS MEDICATIONS: none to date  ALLERGIES:   Allergies  Allergen Reactions   Rosuvastatin Other (See Comments)   Penicillin G Other (See Comments)    Reaction unknown    CURRENT MEDICATIONS:  Current Meds  Medication Sig   acetaminophen (TYLENOL) 500 MG tablet Take 500 mg by mouth every 4 (four) hours as needed.   alfuzosin (UROXATRAL) 10 MG 24 hr tablet TAKE 1 TABLET BY MOUTH TWICE DAILY   carboxymethylcellul-glycerin (OPTIVE) 0.5-0.9 % ophthalmic solution Place 1 drop into both eyes as needed.   ezetimibe (ZETIA) 10 MG tablet Take 10 mg by mouth daily.   finasteride (PROSCAR) 5 MG tablet TAKE 1 TABLET BY MOUTH EVERY Cloninger   loratadine (CLARITIN) 10 MG tablet Take 10 mg by mouth daily as needed.   midodrine (PROAMATINE) 2.5 MG tablet Take 1 tablet (2.5 mg total) by mouth 3 (three) times daily with meals.   Multiple Vitamin (MULTI VITAMIN DAILY PO) daily.   Probiotic Product (PROBIOTIC PO) daily.   psyllium (METAMUCIL) 58.6 % packet Take 1 packet by mouth daily.     Objective:   VITALS:   Vitals:   11/09/23 1010  BP: (!) 152/75  Pulse: 73  SpO2: 98%  Weight: 159 lb (72.1 kg)  Height: 5' 11 (1.803 m)    GEN:  The patient appears stated age and is in NAD. HEENT:  Normocephalic, atraumatic.  The mucous membranes are moist. The superficial temporal arteries are without ropiness or tenderness. CV:  RRR Lungs:  CTAB Neck/HEME:  There are no carotid bruits bilaterally.  Neurological examination:  Orientation: The patient is alert and oriented x3.  Cranial nerves: There is good facial symmetry.  There is bilateral pseudoptosis due to lid lag.  Extraocular muscles are intact. The visual fields are full to confrontational testing. The speech is fluent and clear. Soft palate rises symmetrically and there is no tongue deviation. Hearing is intact to conversational  tone. Sensation: Sensation is intact to light and pinprick throughout (facial, trunk, extremities). Vibration is intact at the bilateral ankle. There is no extinction with double simultaneous stimulation. There is no sensory dermatomal level identified. Motor: Strength is 5/5 in the bilateral upper and lower extremities.   Shoulder shrug is equal and symmetric.  There is no pronator drift. Deep tendon reflexes: Deep tendon reflexes are 2/4 at the bilateral biceps, triceps, brachioradialis, patella and achilles. Plantar responses are downgoing bilaterally.  Movement examination: Tone: There is mild increased tone in the RUE Abnormal movements: there is RUE rest tremor that increases with distraction Coordination:  There is mild decremation with RAM's, only with hand opening and closing, finger taps and action of turning in a light bulb but this  was on the L.  There was motor overflow into the mouth Gait and Station: The patient has no difficulty arising out of a deep-seated chair without the use of the hands. The patient's stride length is good but he does have mild decreased arm swing in the bilateral UE.  I have reviewed and interpreted the following labs independently   Chemistry      Component Value Date/Time   NA 138 07/03/2009 2233   K 3.6 07/03/2009 2233   CL 105 07/03/2009 2233   BUN 18 07/03/2009 2233   CREATININE 1.4 07/03/2009 2233   No results found for: CALCIUM, ALKPHOS, AST, ALT, BILITOT    No results found for: TSH Lab Results  Component Value Date   HGB 15.6 07/03/2009   HCT 46.0 07/03/2009     Total time spent on today's visit was 60 minutes, including both face-to-face time and nonface-to-face time.  Time included that spent on review of records (prior notes available to me/labs/imaging if pertinent), discussing treatment and goals, answering patient's questions and coordinating care.  Cc:  Avva, Ravisankar, MD

## 2023-11-05 ENCOUNTER — Encounter: Admitting: Occupational Therapy

## 2023-11-09 ENCOUNTER — Encounter: Payer: Self-pay | Admitting: Neurology

## 2023-11-09 ENCOUNTER — Encounter: Payer: Self-pay | Admitting: Cardiovascular Disease

## 2023-11-09 ENCOUNTER — Ambulatory Visit: Admitting: Neurology

## 2023-11-09 ENCOUNTER — Telehealth: Payer: Self-pay | Admitting: Neurology

## 2023-11-09 VITALS — BP 152/75 | HR 73 | Ht 71.0 in | Wt 159.0 lb

## 2023-11-09 DIAGNOSIS — G903 Multi-system degeneration of the autonomic nervous system: Secondary | ICD-10-CM

## 2023-11-09 DIAGNOSIS — G20A1 Parkinson's disease without dyskinesia, without mention of fluctuations: Secondary | ICD-10-CM

## 2023-11-09 DIAGNOSIS — N183 Chronic kidney disease, stage 3 unspecified: Secondary | ICD-10-CM

## 2023-11-09 MED ORDER — CARBIDOPA-LEVODOPA 25-100 MG PO TABS: 1.0000 | ORAL_TABLET | Freq: Three times a day (TID) | ORAL | 0 refills | Status: DC

## 2023-11-09 NOTE — Telephone Encounter (Signed)
 Pt said appointment no longer needed

## 2023-11-09 NOTE — Patient Instructions (Addendum)
 Start Carbidopa Levodopa as follows: Take 1/2 tablet three times daily, at least 30 minutes before meals (approximately 7am/11am/4pm), for one week Then take 1/2 tablet in the morning, 1/2 tablet at 11am, 1 tablet in the evening at 4pm, at least 30 minutes before meals, for one week Then take 1/2 tablet in the morning, 1 tablet at 11am, 1 tablet in the evening at 4pm, at least 30 minutes before meals, for one week Then take 1 tablet three times daily at 7am/11am/4pm, at least 30 minutes before meals   As a reminder, carbidopa/levodopa can be taken at the same time as a carbohydrate, but we like to have you take your pill either 30 minutes before a protein source or 1 hour after as protein can interfere with carbidopa/levodopa absorption.   The physicians and staff at [facility] are committed to providing excellent care. You may receive a survey requesting feedback about your experience at our office. We strive to receive very good responses to the survey questions. If you feel that your experience would prevent you from giving the office a very good  response, please contact our office to try to remedy the situation. We may be reached at [phone]. Thank you for taking the time out of your busy [employer] to complete the survey.

## 2023-11-10 ENCOUNTER — Ambulatory Visit: Payer: Self-pay | Admitting: Physical Therapy

## 2023-11-10 ENCOUNTER — Encounter: Payer: Self-pay | Admitting: Physical Therapy

## 2023-11-10 ENCOUNTER — Ambulatory Visit: Admitting: Occupational Therapy

## 2023-11-10 ENCOUNTER — Encounter: Payer: Self-pay | Admitting: Occupational Therapy

## 2023-11-10 ENCOUNTER — Encounter: Payer: Self-pay | Admitting: Neurology

## 2023-11-10 VITALS — BP 120/51 | HR 83

## 2023-11-10 DIAGNOSIS — R293 Abnormal posture: Secondary | ICD-10-CM

## 2023-11-10 DIAGNOSIS — R278 Other lack of coordination: Secondary | ICD-10-CM

## 2023-11-10 DIAGNOSIS — R29818 Other symptoms and signs involving the nervous system: Secondary | ICD-10-CM

## 2023-11-10 DIAGNOSIS — R2681 Unsteadiness on feet: Secondary | ICD-10-CM

## 2023-11-10 DIAGNOSIS — R2689 Other abnormalities of gait and mobility: Secondary | ICD-10-CM

## 2023-11-10 DIAGNOSIS — R29898 Other symptoms and signs involving the musculoskeletal system: Secondary | ICD-10-CM

## 2023-11-10 DIAGNOSIS — M6281 Muscle weakness (generalized): Secondary | ICD-10-CM

## 2023-11-10 NOTE — Therapy (Signed)
 OUTPATIENT OCCUPATIONAL THERAPY NEURO TREATMENT  Patient Name: Carl Nichols MRN: [id number] DOB:1940-01-30, 83 y.o., male Today's Date: 11/10/2023  PCP: [name] Santos, MD REFERRING PROVIDER: [name] Santos, MD  END OF SESSION:  OT End of Session - 11/10/23 0931     Visit Number 5    Number of Visits 17    Date for Recertification  11/29/23    Authorization Type [employer] MCR approved 8 O.T. visits 9/16 - 11/24/23    Authorization Time Period visit limit MN    Progress Note Due on Visit 10    OT Start Time 0930    OT Stop Time 1015    OT Time Calculation (min) 45 min    Activity Tolerance Patient tolerated treatment well    Behavior During Therapy 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   ONSET DATE: 09/07/2023  REFERRING DIAG: R29.898 (ICD-10-CM) - Other symptoms and signs involving the musculoskeletal system  THERAPY DIAG:  Other symptoms and signs involving the nervous system  Unsteadiness on feet  Other lack of coordination  Abnormal posture  Other symptoms and signs involving the musculoskeletal system  Rationale for Evaluation and Treatment: Rehabilitation  SUBJECTIVE:   SUBJECTIVE STATEMENT: I got the official diagnosis of PD by Dr. Evonnie. She ordered carbidopa-levodopa but waiting on the prescription now. I've started something for my low BP.  Pt accompanied by: self   PERTINENT HISTORY: Pt is an 83 year old man who has had  progressive worsening of gait, orthostatic hypotension, and a right arm tremor. Concerns noted at most recent office visit with [facility] on 07/20/23 for Parkinson's but did not fulfill enough criteria at that time (normal muscle tone, no gait retropulsion, no definite bradykinesia). Noted less physical endurance and worsening RUE tremor when completing tasks such as toileting, holding feeding utensils, and writing. Pt was receiving SLP services at this location from 07/03/23-09/08/23  PRECAUTIONS: Fall  WEIGHT BEARING RESTRICTIONS: No  PAIN:  Are you having pain? No  FALLS: Has patient fallen in last 6 months? No  LIVING ENVIRONMENT: Lives with: lives with their spouse Lives in: [place] home, level entry, 5 STE to bedroom on left side going down and rail, den 8 on left side going down Has following equipment at home: Grab bars in walk in shower, high toilet seat  PLOF: Independentneeds some help with buttons, jackets, slower with self feeding, more difficulty recently with wiping  PATIENT GOALS: I would like to improve my handwriting and strengthen my R arm.   OBJECTIVE:  Note: Objective measures were completed at Evaluation unless otherwise noted.  HAND DOMINANCE: Right  ADLs: Overall ADLs: I/Mod I with ADLs, reported increased time with buttons and putting on jacket Transfers/ambulation related to ADLs: Eating: slower eating Grooming: I UB Dressing: difficulty/slower w/  buttons LB Dressing: I Toileting: slightly slower with wiping  Bathing: I/Mod I Tub Shower transfers: I Equipment: Grab bars and Walk in shower  IADLs: Shopping: I Light housekeeping: I Meal Prep: spouse does cooking, pt cleans Community mobility: I Medication management: Mod I with pill organizer, no difficulty manipulating pills Financial management: I Handwriting: 95-100% legible in print, Mild micrographia, Moderate micrographia, and some spacing issues noted as the words are  fairly close together. Reports writing print and micrographia getting worse with fatigue.  MOBILITY STATUS: Independent is still driving, some stiffness in neck reported. Slower getting seatbelt on per spouse report.   POSTURE COMMENTS:  rounded shoulders and forward head Sitting balance: WFL  ACTIVITY TOLERANCE: Activity tolerance: Reports some increased fatigue, unsure if d/t old age or Parkinson's.  FUNCTIONAL OUTCOME MEASURES: Simulated eating (PPT#2) in R dominant hand: 11.70 seconds Simulated donning/doffing jacket (PPT #4): 20.85 seconds  3 button/unbutton: 27.28 seconds   UPPER EXTREMITY ROM:  WNL, some cueing required to fully extend elbows when flexing BUEs to 90 degrees.  Active ROM Right eval Left eval  Shoulder flexion    Shoulder abduction    Shoulder adduction    Shoulder extension    Shoulder internal rotation    Shoulder external rotation    Elbow flexion    Elbow extension    Wrist flexion    Wrist extension    Wrist ulnar deviation    Wrist radial deviation    Wrist pronation    Wrist supination    (Blank rows = not tested)  UPPER EXTREMITY MMT:   LUE WNL  MMT Right eval Left eval  Shoulder flexion 4   Shoulder abduction    Shoulder adduction    Shoulder extension    Shoulder internal rotation    Shoulder external rotation    Middle trapezius    Lower trapezius    Elbow flexion 4   Elbow extension    Wrist flexion    Wrist extension    Wrist ulnar deviation    Wrist radial deviation    Wrist pronation    Wrist supination    (Blank rows = not tested)  HAND FUNCTION: Grip strength: Right: 57.3 lbs; Left: 52.6 lbs  COORDINATION: 9 Hole Peg test: Right: 24.80 sec; Left: 33.96 sec Box and Blocks:  Right 49 blocks, Left 50 blocks- noted pt at times did not clear partition. Tremors: Resting and Right Simulated eating (PPT#2) in R dominant hand: 11.70 seconds Simulated donning/doffing jacket (PPT #4): 20.85 seconds  3 button/unbutton:  27.28 seconds   SENSATION: WFL  EDEMA: swelling in B ankles, PT aware.  MUSCLE TONE: RUE: slight catch, minimal cogwheel ridigity   COGNITION: Overall cognitive status: Within functional limits for tasks assessed  VISION: Subjective report: Slight changes in vision over the years.  Baseline vision: Wears glasses all the time Visual history: cataract surgery  VISION ASSESSMENT: WFL  Patient has difficulty with following activities due to following visual impairments:  PERCEPTION: Not tested  PRAXIS: Not tested  OBSERVATIONS: Concerns with handwriting noted and some slight weakness noted in RUE. Some stiffness noted as well and mild cogwheel rigidity.  TREATMENT DATE: 11/10/23  BP seated: 152/75. Pt now on meds to help control low BP Pt also has gotten official diagnosis of PD from Dr. Evonnie since last seen in O.T.  Reviewed PWR! Hands and coordination HEP - Pt doing better with coordination activities Rt hand. Pt requires cues for elbow extension  Began reviewing PWR! Seated but will need further review as pt is still having difficulty w/ achieving large amplitude movements and compensations noted especially with PWR! Rock seated. Pt needs cues to look up at hand for eye boosts during PWR! Rock and to prevent trunk rotation  Pt concerned with too many ex's - pt informed that currently he can easily do all of them everyday since he doesn't have that many. However, as O.T. and P.T. add more, we will provide a PD exercise chart for greater ease/carryover and to disperse equally t/o week.   Pt shown how to access Hospital Guide through [employer].    PATIENT EDUCATION: Education details: see above Person educated: Patient and Spouse Education method: Explanation, Demonstration, Verbal cues, and Handouts Education comprehension:  verbalized understanding, returned demonstration, verbal cues required, and needs further education   HEP's 10/06/23: PWR! Sitting 10/12/23: PWR! Hands and coordination HEP  10/23/23: Handwriting strategies  GOALS: Goals reviewed with patient? Yes  SHORT TERM GOALS: Target date: 10/29/23 GOALS:  SHORT TERM GOALS: Target date: 10/29/23    Pt will be independent with PD specific HEP.  Baseline: not yet initiated Goal status: IN PROGRESS  2.  Pt will verbalize understanding of adapted strategies to maximize safety and independence with ADLs/IADLs.  Baseline: not yet initiated Goal status: INITIAL  3.  Pt will write a sentence with no significant decrease in size and maintain 95% legibility and spacing.   Baseline: 95-100% legibility Goal status: MET  4.  Pt will demonstrate improved fine motor coordination for ADLs as evidenced by decreasing 9 hole peg test score for L hand by at least 5 seconds.  Baseline: 33.96 seconds Goal status: IN PROGRESS  5.  Pt will demonstrate improved ease with fastening buttons as evidenced by decreasing 3 button/unbutton time by at least 25 seconds.  Baseline: 27.85 seconds Goal status: INITIAL  6.  Pt will be able to place at least 3 or more blocks using bilateral hand with completion of Box and Blocks test.  Baseline: 49 R hand, 50 L hand blocks Goal status: INITIAL   LONG TERM GOALS: Target date: 11/29/23   Pt will verbalize understanding of ways to prevent future PD related complications and PD community resources.  Baseline: not yet initiated Goal status: INITIAL  2.  Pt will write a short paragraph with no significant decrease in size and maintain 100% legibility.  Baseline: 95-100% legibility with spacing concerns Goal status: INITIAL  3.  Pt will verbalize understanding of ways to keep thinking skills sharp and ways to compensate for STM changes in the future.  Baseline: not yet initiated Goal status: INITIAL  4.  Pt  will demonstrate increased ease with dressing as evidenced by decreasing PPT#4 (don/ doff jacket) to 16 secs or less.  Baseline: 20.85 seconds Goal status: INITIAL  5. Pt will improve RUE shoulder and elbow flexion strength to 5/5 for improved ability to complete functional transfers and complete dressing tasks.   Baseline: 4/5   Goal status: INITIAL        ASSESSMENT:  CLINICAL IMPRESSION: Patient returns today after a break due to medical reasons - low BP, which is  now under better control w/ medication. Pt also with official PD diagnosis. Hx includes dizziness, pneumonia, cataract surgery. Patient currently presents minimally below baseline level of functioning demonstrating functional deficits and impairments as noted below. Pt would benefit from continued skilled OT services in the outpatient setting to work on impairments as noted below to help pt return to PLOF as able.    PERFORMANCE DEFICITS: in functional skills including ADLs, coordination, tone, strength, Fine motor control, and tremors.   IMPAIRMENTS: are limiting patient from ADLs and IADLs.   CO-MORBIDITIES: may have co-morbidities  that affects occupational performance. Patient will benefit from skilled OT to address above impairments and improve overall function.  MODIFICATION OR ASSISTANCE TO COMPLETE EVALUATION: Min-Moderate modification of tasks or assist with assess necessary to complete an evaluation.  OT OCCUPATIONAL PROFILE AND HISTORY: Detailed assessment: Review of records and additional review of physical, cognitive, psychosocial history related to current functional performance.  CLINICAL DECISION MAKING: Moderate - several treatment options, min-mod task modification necessary  REHAB POTENTIAL: Good  EVALUATION COMPLEXITY: Moderate    PLAN:  OT FREQUENCY: 2x/week  OT DURATION: 8 weeks +eval  PLANNED INTERVENTIONS: 97168 OT Re-evaluation, 97535 self care/ADL training, 02889 therapeutic  exercise, 97530 therapeutic activity, 97112 neuromuscular re-education, 97140 manual therapy, 97039 fluidotherapy, 97010 moist heat, 97760 Orthotic Initial, 97763 Orthotic/Prosthetic subsequent, functional mobility training, visual/perceptual remediation/compensation, energy conservation, coping strategies training, patient/family education, and DME and/or AE instructions  RECOMMENDED OTHER SERVICES: none  CONSULTED AND AGREED WITH PLAN OF CARE: Patient and family member/caregiver  PLAN FOR NEXT SESSION: Monitor BP, review PWR! Seated more extensively, progress towards remaining STG's  (**Will need resubmission to [facility] for approval to go beyond 11/24/23)   Burnard JINNY Roads, OT 11/10/2023, 9:32 AM

## 2023-11-10 NOTE — Therapy (Signed)
 OUTPATIENT PHYSICAL THERAPY NEURO TREATMENT/RE-CERT   Patient Name: Carl Nichols MRN: [id number] DOB:10/25/1940, 83 y.o., male Today's Date: 11/10/2023   PCP: [name] Santos, MD  REFERRING PROVIDER: [name] Santos, MD  END OF SESSION:  PT End of Session - 11/10/23 1017     Visit Number 3    Number of Visits 9    Date for Recertification  12/10/23   per re-cert on 89/71/74   Authorization Type [employer]    PT Start Time 1017    PT Stop Time 1058    PT Time Calculation (min) 41 min    Equipment Utilized During Treatment Gait belt    Activity Tolerance Patient tolerated treatment well    Behavior During Therapy WFL for tasks assessed/performed           Past Medical History:  Diagnosis Date   BPH (benign prostatic hyperplasia)    Chronic kidney disease    Dizziness    Hyperlipidemia    Pneumonia    march 2018   Stage III chronic kidney disease (HCC)    Past Surgical History:  Procedure Laterality Date   CATARACT EXTRACTION Right    jan 2013   CATARACT EXTRACTION Left    feb 2013   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   ONSET DATE: 08/28/2023  REFERRING DIAG: R26.0 (ICD-10-CM) - Staggering gait  THERAPY DIAG:  Unsteadiness on feet  Other symptoms and signs involving the nervous system  Abnormal posture  Muscle weakness (generalized)  Other abnormalities of gait and mobility  Rationale for Evaluation and Treatment: Rehabilitation  SUBJECTIVE:                                                                                                                                                                                             SUBJECTIVE STATEMENT: Saw Dr. 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 Still waiting for the Carbidopa Levodopa to be picked  up from his pharmacy. Reports BP has been good, has been wearing compression socks. Saw his cardiologist and was prescribed a small dose of Midodrine 3 times a Todaro. Has only felt lightheaded 2 times in the past month. No falls.   Pt accompanied by: Wife, [name]   PERTINENT HISTORY: PMH: HLD, BPH  R arm tremors and difficulty with gait  Orthostatic hypotension   He feels symptoms started about 6 months ago and  has mildly worsened. His wife notes his gait has slowed over the past 6 months.   PAIN:  Are you having pain? No  PRECAUTIONS: Other: Orthostatic hypotension    FALLS: Has patient fallen in last 6 months? 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  DIAGNOSTIC FINDINGS: MRI brain 08/13/23: IMPRESSION: Normal  COGNITION: Overall cognitive status: Within functional limits for tasks assessed   SENSATION: Light touch: WFL, but pt reporting inside of his calf felt different than the outside   COORDINATION: Heel to shin: slightly bradykinetic with RLE   OBSERVATION: RUE tremor   EDEMA:  Mild swelling to bilateral ankles   POSTURE: rounded shoulders, forward head, and increased thoracic kyphosis  LOWER EXTREMITY ROM:    Decr knee extension AROM   LOWER EXTREMITY MMT:    MMT Right Eval Left Eval  Hip flexion 4+ 5  Hip extension    Hip abduction 4 4  Hip adduction 5 5  Hip internal rotation    Hip external rotation    Knee flexion 5 5  Knee  extension 5 4+  Ankle dorsiflexion    Ankle plantarflexion    Ankle inversion    Ankle eversion    (Blank rows = not tested)  All tested in sitting   BED MOBILITY:  Pt reports slower movements getting in and out of the bed   TRANSFERS: Sit to stand: Modified independence  Assistive device utilized: None     Stand to sit: Modified independence  Assistive device utilized: None      Pt with decr forward lean   GAIT: Findings: Gait Characteristics: step through pattern, decreased arm swing- Right, decreased arm swing- Left, decreased stride length, decreased trunk rotation, and trunk flexed, Distance walked: Clinic distances , Assistive device utilized:None, Level of assistance: Modified independence, and Comments: incr forward head, decr arm swing RUE>LUE  Pt reports his walking feels different, not as relaxed and comfortable   FUNCTIONAL TESTS:  5 times sit to stand: 11.1 seconds with no UE support  10 meter walk test: 9.5 seconds with no AD = 3.45 ft/sec    [facility] PT Assessment - 11/10/23 1027       Standardized Balance Assessment   Standardized Balance Assessment Mini-BESTest      Mini-BESTest   Sit To Stand Normal: Comes to stand without use of hands and stabilizes independently.    Rise to Toes Moderate: Heels up, but not full range (smaller than when holding hands), OR noticeable instability for 3 s.   unable to get full range   Stand on one leg (left) Moderate: < 20 s   4 seconds   Stand on one leg (right) Moderate: < 20 s   1.2 , 4.2   Stand on one leg - lowest score 1    Compensatory Stepping Correction - Forward Normal: Recovers independently with a single, large step (second realignement is allowed).    Compensatory Stepping Correction - Backward Normal: Recovers independently  with a single, large step    Compensatory Stepping Correction - Left Lateral Normal: Recovers independently with 1 step (crossover or lateral OK)    Compensatory Stepping Correction - Right  Lateral Normal: Recovers independently with 1 step (crossover or lateral OK)    Stepping Corredtion Lateral - lowest score 2    Stance - Feet together, eyes open, firm surface  Normal: 30s    Stance - Feet together, eyes closed, foam surface  Normal: 30s    Incline - Eyes Closed Normal: Stands independently 30s and aligns with gravity    Change in Gait Speed Normal: Significantly changes walkling speed without imbalance    Walk with head turns - Horizontal Moderate: performs head turns with reduction in gait speed.    Walk with pivot turns Moderate:Turns with feet close SLOW (>4 steps) with good balance.    Step over obstacles Normal: Able to step over box with minimal change of gait speed and with good balance.    Timed UP & GO with Dual Task Moderate: Dual Task affects either counting OR walking (>10%) when compared to the TUG without Dual Task.    Mini-BEST total score 23      Timed Up and Go Test   Normal TUG (seconds) 8.4    Cognitive TUG (seconds) 11.5   starting at 77, counting backwards by 3                                                                                                                                      TREATMENT DATE: 11/10/23  Self-Care:  Vitals:   11/10/23 1024 11/10/23 1025  BP: (!) 146/67 (!) 120/51  Pulse: 74 83    Seated, Standing  Pt wearing compression socks, drinking water, and reports no lightheadedness or dizziness in standing. After seeing cardiologist, pt taking a low dose of Midodrine 3x a Nylen. Reviewed instructions from Dr. Tat/cardiologist about incr salt intake (pt adding more salt to his foods) and discussed electrolyte packets like Liquid IV or LMNT   Assessed BP again in standing after balance assessment at 10:45: 122/52, HR: 89 bpm Pt's BP maintained stable    Therapeutic Activity:   [facility] PT Assessment - 11/10/23 1027       Standardized Balance Assessment   Standardized Balance Assessment Mini-BESTest      Mini-BESTest    Sit To Stand Normal: Comes to stand without use of hands and stabilizes independently.    Rise to Toes Moderate: Heels up, but not full range (smaller than when holding hands), OR noticeable instability for 3 s.   unable to get full range   Stand on one leg (left) Moderate: < 20 s   4 seconds   Stand on one leg (right) Moderate: < 20 s   1.2 , 4.2   Stand on one leg - lowest score 1    Compensatory Stepping Correction - Forward Normal:  Recovers independently with a single, large step (second realignement is allowed).    Compensatory Stepping Correction - Backward Normal: Recovers independently with a single, large step    Compensatory Stepping Correction - Left Lateral Normal: Recovers independently with 1 step (crossover or lateral OK)    Compensatory Stepping Correction - Right Lateral Normal: Recovers independently with 1 step (crossover or lateral OK)    Stepping Corredtion Lateral - lowest score 2    Stance - Feet together, eyes open, firm surface  Normal: 30s    Stance - Feet together, eyes closed, foam surface  Normal: 30s    Incline - Eyes Closed Normal: Stands independently 30s and aligns with gravity    Change in Gait Speed Normal: Significantly changes walkling speed without imbalance    Walk with head turns - Horizontal Moderate: performs head turns with reduction in gait speed.    Walk with pivot turns Moderate:Turns with feet close SLOW (>4 steps) with good balance.    Step over obstacles Normal: Able to step over box with minimal change of gait speed and with good balance.    Timed UP & GO with Dual Task Moderate: Dual Task affects either counting OR walking (>10%) when compared to the TUG without Dual Task.    Mini-BEST total score 23      Timed Up and Go Test   Normal TUG (seconds) 8.4    Cognitive TUG (seconds) 11.5   starting at 77, counting backwards by 3        NMR:  Pt performs PWR! Moves in standing position with a chair in front of him as needed for balance     PWR! Up for improved posture 2 x 10 reps, pt reporting feeling a little lightheaded afterwards, cues for mini squat technique, large amplitude movements, and scap retraction   PWR! Rock for improved weight shifting 2 x 10 reps, cues for larger weight shift and looking up at hands when reaching   Cues provided for larger amplitude movements and technique. Educated on how exercises relate to function.   Pt initially reporting RPE as 5/10, cued to try to reach a 7/10  PATIENT EDUCATION: Education details: See self-care, results of miniBEST, will add remainder of standing PWR moves at next appt for HEP Person educated: Patient and Spouse Education method: Explanation, Demonstration, and Handouts Education comprehension: verbalized understanding and returned demonstration  HOME EXERCISE PROGRAM: Will provide at future session   GOALS: Goals reviewed with patient? Yes  SHORT TERM GOALS:  ALL STGS = LTGS   LONG TERM GOALS: Target date: 10/27/2023 UPDATED LTG DATE FOR RE-CERT:   Pt will be independent with final HEP for strength, gait, balance, posture in order to build upon functional gains made in therapy. Baseline:  Goal status: INITIAL  2.  Pt will improve miniBEST to at least a 25/28 in order to demo decr fall risk.  Baseline: 23/28 Goal status: INITIAL  3.  Pt will verbalize understanding of transition to appropriate community fitness classes upon d/c from PT to maximize gains made in therapy.  Baseline:  Goal status: INITIAL    ASSESSMENT:  CLINICAL IMPRESSION:  Pt arrives to PT session after having further follow-up with cardiologist regarding orthostatic hypotension and seeing neurologist for 2nd opinion on Parkinsonism. Pt was prescribed Midodrine from cardiologist and has been wearing compression socks everyday. Pt reports that his lightheadedness is doing much better. Pt saw Dr. Evonnie and she suspects idiopathic Parkinson's Disease and wanted to start  carbidopa/levodopa.  Pt had not yet started it yet and is going to pick it up from the pharmacy later today. Pt's BP still demonstrating orthostatics, but pt not reporting any lightheadedness and BP maintaining stable after standing tasks and pt wearing compression socks. Assessed miniBEST with pt scoring a 23/28, pt most challenged by head turns, cognitive dual tasking, SLS, and anterior weight shifting. LTG updated as appropriate. Re-cert performed today as pt had a delay due to further medical follow-up with cardiologist and neurologist. Pt will benefit from skilled PT to address balance, gait, posture, ROM, functional strength, and education regarding PD. LTGs updated/revised as appropriate.  .   OBJECTIVE IMPAIRMENTS: Abnormal gait, decreased activity tolerance, decreased balance, decreased coordination, difficulty walking, decreased ROM, decreased strength, impaired flexibility, and postural dysfunction.   ACTIVITY LIMITATIONS: bending, transfers, bed mobility, and locomotion level  PARTICIPATION LIMITATIONS: community activity  PERSONAL FACTORS: Age, Behavior pattern, Past/current experiences, Time since onset of injury/illness/exacerbation, and 1-2 comorbidities: HLD, BPH are also affecting patient's functional outcome.   REHAB POTENTIAL: Good  CLINICAL DECISION MAKING: Evolving/moderate complexity  EVALUATION COMPLEXITY: Moderate  PLAN:  PT FREQUENCY: 2x/week  PT DURATION: 6 weeks - due to delay in scheduling, anticipate just 8 PT visits for 4 weeks   PLANNED INTERVENTIONS: [id number]- PT Re-evaluation, 97110-Therapeutic exercises, 97530- Therapeutic activity, 97112- Neuromuscular re-education, 97535- Self Care, [id number]- Manual therapy, [phone]- Gait training, Patient/Family education, Balance training, and Stair training  PLAN FOR NEXT SESSION:  Initiate HEP for standing PWR moves, posture, balance - SLS, unlevel surfaces  Work on reciprocal arm swing with gait, incr forward lean with sit  <> stands    [employer], PT, DPT 11/10/2023, 11:43 AM

## 2023-11-11 ENCOUNTER — Ambulatory Visit: Attending: Cardiology

## 2023-11-11 DIAGNOSIS — R0602 Shortness of breath: Secondary | ICD-10-CM

## 2023-11-11 LAB — ECHOCARDIOGRAM COMPLETE
AR max vel: 3.66 cm2
AV Area VTI: 2.97 cm2
AV Area mean vel: 3.33 cm2
AV Mean grad: 3 mmHg
AV Peak grad: 4.7 mmHg
AV Vena cont: 0.5 cm
Ao pk vel: 1.09 m/s
Area-P 1/2: 3.28 cm2
MV VTI: 1.6 cm2
P 1/2 time: 495 ms
S' Lateral: 3 cm

## 2023-11-11 NOTE — Telephone Encounter (Signed)
 I talked to patient and asked him to check with pharmacy to see if they could cut pills in half for him. Make sure he has a sharp cutter I am not sure what else to do for this

## 2023-11-12 ENCOUNTER — Encounter: Payer: Self-pay | Admitting: Physical Therapy

## 2023-11-12 ENCOUNTER — Encounter: Payer: Self-pay | Admitting: Occupational Therapy

## 2023-11-12 ENCOUNTER — Ambulatory Visit: Payer: Self-pay | Admitting: Physical Therapy

## 2023-11-12 ENCOUNTER — Ambulatory Visit: Admitting: Occupational Therapy

## 2023-11-12 ENCOUNTER — Encounter: Payer: Self-pay | Admitting: Cardiovascular Disease

## 2023-11-12 VITALS — BP 128/58 | HR 81

## 2023-11-12 DIAGNOSIS — R2689 Other abnormalities of gait and mobility: Secondary | ICD-10-CM

## 2023-11-12 DIAGNOSIS — R293 Abnormal posture: Secondary | ICD-10-CM

## 2023-11-12 DIAGNOSIS — R29898 Other symptoms and signs involving the musculoskeletal system: Secondary | ICD-10-CM

## 2023-11-12 DIAGNOSIS — R29818 Other symptoms and signs involving the nervous system: Secondary | ICD-10-CM

## 2023-11-12 DIAGNOSIS — R2681 Unsteadiness on feet: Secondary | ICD-10-CM

## 2023-11-12 DIAGNOSIS — M6281 Muscle weakness (generalized): Secondary | ICD-10-CM

## 2023-11-12 DIAGNOSIS — R278 Other lack of coordination: Secondary | ICD-10-CM | POA: Diagnosis not present

## 2023-11-12 NOTE — Patient Instructions (Signed)
 Performing Daily Activities with Big Movements  Pick at least 2 activities a day and perform with BIG, DELIBERATE movements/effort.  Try different activities each day. This can make the activity easier and turn daily activities into exercise to prevent problems in the future!  If you are standing during the activity, make sure to keep feet apart and stand with good/big/posture.  Examples: Dressing  Pull-over shirt:  good posture, bring shirt to head (don't bring head down), deliberately push arms into sleeves Jacket:  stand with feet apart, deliberately push arms into sleeves Underwear/Pants/Shoes/Socks:  Sit, lean forward, and push each foot in deliberately 1 at a time Open hands to pull down shirt/put on socks/pull up pants--get more material in your hand  Buttoning - Open hands big before fastening each button, deliberate movement (angry buttons--push button through hole), unfasten by using pull-push method   Bathing - Wash/dry with long strokes  Brushing your teeth - Big, slow movements  Cutting food - Long deliberate cuts, put tip of knife down in front of food  Eating - Hold utensil in the middle (not the end), hold fork straight up and down to stab food  Picking up a cup/bottle - Open hand up big and get object all the way in palm  Opening jar/bottle - Move as much as you can with each turn, twist wrist  Putting on seatbelt - Feet apart, twist when reaching across body, look at where you are reaching  Hanging up clothes/getting clothes down from closet - Reach with big effort, open hand, straighten elbow  Putting away groceries/dishes - Reach with big effort  Wiping counter/table - Move in big, long strokes, open hand  Stirring while cooking - Exaggerate movement  Cleaning windows - Feet apart, move in big, long strokes  Sweeping/Raking- Feet apart and one foot in front of the other, move arms in big, long strokes  Vacuuming - Feet apart and one in front of the other,  push with big movement  Folding clothes - Exaggerate arm movements  Washing car - Move in big, long strokes, feet apart  Changing light bulb or Using a screwdriver - Move as much as you can with each turn, twist wrist  Walking into a store/restaurant - Walk with big steps, good posture, swing arms if able  Standing up from a chair/recliner/sofa - Scoot forward, lean forward, and stand with big effort  Picking up something from floor/reaching in low cabinet - Get close to object, position feet apart and one in front of the other

## 2023-11-12 NOTE — Therapy (Signed)
 OUTPATIENT PHYSICAL THERAPY NEURO TREATMENT/RE-CERT   Patient Name: Carl Nichols MRN: [id number] DOB:[birth year], 83 y.o., male Today's Date: 11/12/2023   PCP: [name] Santos, MD  REFERRING PROVIDER: [name] Santos, MD  END OF SESSION:  PT End of Session - 11/12/23 1156     Visit Number 4    Number of Visits 9    Date for Recertification  12/10/23   per re-cert on 89/71/74   Authorization Type [employer]    PT Start Time 1148    PT Stop Time 1228    PT Time Calculation (min) 40 min    Equipment Utilized During Treatment Gait belt    Activity Tolerance Patient tolerated treatment well    Behavior During Therapy WFL for tasks assessed/performed           Past Medical History:  Diagnosis Date   BPH (benign prostatic hyperplasia)    Chronic kidney disease    Dizziness    Hyperlipidemia    Pneumonia    march 2018   Stage III chronic kidney disease (HCC)    Past Surgical History:  Procedure Laterality Date   CATARACT EXTRACTION Right    jan 2013   CATARACT EXTRACTION Left    feb 2013   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   ONSET DATE: 08/28/2023  REFERRING DIAG: R26.0 (ICD-10-CM) - Staggering gait  THERAPY DIAG:  Unsteadiness on feet  Abnormal posture  Muscle weakness (generalized)  Other abnormalities of gait and mobility  Rationale for Evaluation and Treatment: Rehabilitation  SUBJECTIVE:                                                                                                                                                                                             SUBJECTIVE STATEMENT: Started his Carbidopa Levodopa yesterday. Asking which type of stationary bike to buy.   Pt accompanied by: Wife, [name]   PERTINENT HISTORY: PMH: HLD, BPH  R arm tremors and difficulty  with gait  Orthostatic hypotension   He feels symptoms started about 6 months ago and has mildly worsened. His wife notes his gait has slowed over the past 6 months.   PAIN:  Are you having pain? No  PRECAUTIONS: Other: Orthostatic hypotension    FALLS: Has patient fallen in last 6 months? No  LIVING ENVIRONMENT: Lives with: lives with their spouse Lives in: House/apartment Stairs: Yes: Internal: 5 up  and 8 down, lives in a tri-level steps; on right going up and External: 2 steps; on left going up Has following equipment at home: Single point cane, Grab bars, and does not use it   PLOF: Independent and Leisure: active in the church, puttering in the yard   PATIENT GOALS: wants to deal with Parkinson's thinks his R hand has gotten a little worse   OBJECTIVE:  Note: Objective measures were completed at Evaluation unless otherwise noted.  DIAGNOSTIC FINDINGS: MRI brain 08/13/23: IMPRESSION: Normal  COGNITION: Overall cognitive status: Within functional limits for tasks assessed   SENSATION: Light touch: WFL, but pt reporting inside of his calf felt different than the outside   COORDINATION: Heel to shin: slightly bradykinetic with RLE   OBSERVATION: RUE tremor   EDEMA:  Mild swelling to bilateral ankles   POSTURE: rounded shoulders, forward head, and increased thoracic kyphosis  LOWER EXTREMITY ROM:    Decr knee extension AROM   LOWER EXTREMITY MMT:    MMT Right Eval Left Eval  Hip flexion 4+ 5  Hip extension    Hip abduction 4 4  Hip adduction 5 5  Hip internal rotation    Hip external rotation    Knee flexion 5 5  Knee extension 5 4+  Ankle dorsiflexion    Ankle plantarflexion    Ankle inversion    Ankle eversion    (Blank rows = not tested)  All tested in sitting   BED MOBILITY:  Pt reports slower movements getting in and out of the bed   TRANSFERS: Sit to stand: Modified independence  Assistive device utilized: None     Stand to sit:  Modified independence  Assistive device utilized: None      Pt with decr forward lean   GAIT: Findings: Gait Characteristics: step through pattern, decreased arm swing- Right, decreased arm swing- Left, decreased stride length, decreased trunk rotation, and trunk flexed, Distance walked: Clinic distances , Assistive device utilized:None, Level of assistance: Modified independence, and Comments: incr forward head, decr arm swing RUE>LUE  Pt reports his walking feels different, not as relaxed and comfortable   FUNCTIONAL TESTS:  5 times sit to stand: 11.1 seconds with no UE support  10 meter walk test: 9.5 seconds with no AD = 3.45 ft/sec                                                                                                                                   TREATMENT DATE: 11/10/23  Self-Care:  Vitals:   11/12/23 1157 11/12/23 1158  BP: (!) 140/65 (!) 128/58  Pulse: 71 81   Seated, Standing  Pt wearing compression socks, drinking water, and reports no lightheadedness or dizziness in standing.  Answered pt's questions regarding what stationary bike to purchase for home    Therapeutic Activity: SciFit with BUE/BLE Multi-peaks at Gear 4.0 > 5.0 for 8 minutes for neural priming, reciprocal movement patterns,  large amplitude movements, and aerobic warm-up. Pt reporting RPE as 5-6/10    NMR:  Pt performs PWR! Moves in standing position with a chair in front of him as needed for balance    PWR! Up for improved posture 2 x 10 reps, pt reporting feeling a little lightheaded afterwards, cues for mini squat technique, large amplitude movements, and scap retraction   PWR! Rock for improved weight shifting 2 x 10 reps, cues for larger weight shift and looking up at hands when reaching   PWR! Twist for improved trunk rotation 10 reps, cues to reset in the middle with tall posture   PWR! Step for improved step initiation 2 x 10 reps   Cues provided for larger amplitude  movements and technique. Educated on how exercises relate to function.    PATIENT EDUCATION: Education details: See self-care, results of miniBEST, will add remainder of standing PWR moves at next appt for HEP Person educated: Patient and Spouse Education method: Explanation, Demonstration, and Handouts Education comprehension: verbalized understanding and returned demonstration  HOME EXERCISE PROGRAM: Will provide at future session   GOALS: Goals reviewed with patient? Yes  SHORT TERM GOALS:  ALL STGS = LTGS   LONG TERM GOALS: Target date: 10/27/2023 UPDATED LTG DATE FOR RE-CERT:   Pt will be independent with final HEP for strength, gait, balance, posture in order to build upon functional gains made in therapy. Baseline:  Goal status: INITIAL  2.  Pt will improve miniBEST to at least a 25/28 in order to demo decr fall risk.  Baseline: 23/28 Goal status: INITIAL  3.  Pt will verbalize understanding of transition to appropriate community fitness classes upon d/c from PT to maximize gains made in therapy.  Baseline:  Goal status: INITIAL    ASSESSMENT:  CLINICAL IMPRESSION:  Pt arrives to PT session after having further follow-up with cardiologist regarding orthostatic hypotension and seeing neurologist for 2nd opinion on Parkinsonism. Pt was prescribed Midodrine from cardiologist and has been wearing compression socks everyday. Pt reports that his lightheadedness is doing much better. Pt saw Dr. Evonnie and she suspects idiopathic Parkinson's Disease and wanted to start carbidopa/levodopa. Pt had not yet started it yet and is going to pick it up from the pharmacy later today. Pt's BP still demonstrating orthostatics, but pt not reporting any lightheadedness and BP maintaining stable after standing tasks and pt wearing compression socks. Assessed miniBEST with pt scoring a 23/28, pt most challenged by head turns, cognitive dual tasking, SLS, and anterior weight shifting. LTG updated  as appropriate. Re-cert performed today as pt had a delay due to further medical follow-up with cardiologist and neurologist. Pt will benefit from skilled PT to address balance, gait, posture, ROM, functional strength, and education regarding PD. LTGs updated/revised as appropriate.  .   OBJECTIVE IMPAIRMENTS: Abnormal gait, decreased activity tolerance, decreased balance, decreased coordination, difficulty walking, decreased ROM, decreased strength, impaired flexibility, and postural dysfunction.   ACTIVITY LIMITATIONS: bending, transfers, bed mobility, and locomotion level  PARTICIPATION LIMITATIONS: community activity  PERSONAL FACTORS: Age, Behavior pattern, Past/current experiences, Time since onset of injury/illness/exacerbation, and 1-2 comorbidities: HLD, BPH are also affecting patient's functional outcome.   REHAB POTENTIAL: Good  CLINICAL DECISION MAKING: Evolving/moderate complexity  EVALUATION COMPLEXITY: Moderate  PLAN:  PT FREQUENCY: 2x/week  PT DURATION: 6 weeks - due to delay in scheduling, anticipate just 8 PT visits for 4 weeks   PLANNED INTERVENTIONS: 97164- PT Re-evaluation, 97110-Therapeutic exercises, 97530- Therapeutic activity, [id number]- Neuromuscular re-education, 97535- Self Care,  02859- Manual therapy, [phone]- Gait training, Patient/Family education, Balance training, and Stair training  PLAN FOR NEXT SESSION:  Initiate HEP for standing PWR moves, posture, balance - SLS, unlevel surfaces  Work on reciprocal arm swing with gait, incr forward lean with sit <> stands    [place] LOISE Senate, PT, DPT 11/12/2023, 12:38 PM

## 2023-11-12 NOTE — Therapy (Signed)
 OUTPATIENT OCCUPATIONAL THERAPY NEURO TREATMENT  Patient Name: Kiah Keay Dromgoole MRN: [id number] DOB:10-07-1940, 83 y.o., male Today's Date: 11/12/2023  PCP: [name] Santos, MD REFERRING PROVIDER: [name] Santos, MD  END OF SESSION:  OT End of Session - 11/12/23 1107     Visit Number 6    Number of Visits 17    Date for Recertification  11/29/23    Authorization Type [employer] MCR approved 8 O.T. visits 9/16 - 11/24/23    Authorization Time Period visit limit [place]    Progress Note Due on Visit 10    OT Start Time 1100    OT Stop Time 1145    OT Time Calculation (min) 45 min    Activity Tolerance Patient tolerated treatment well    Behavior During Therapy [facility] for tasks assessed/performed          Past Medical History:  Diagnosis Date   BPH (benign prostatic hyperplasia)    Chronic kidney disease    Dizziness    Hyperlipidemia    Pneumonia    march 2018   Stage III chronic kidney disease (HCC)    Past Surgical History:  Procedure Laterality Date   CATARACT EXTRACTION Right    jan 2013   CATARACT EXTRACTION Left    feb 2013   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   ONSET DATE: 09/07/2023  REFERRING DIAG: R29.898 (ICD-10-CM) - Other symptoms and signs involving the musculoskeletal system  THERAPY DIAG:  Unsteadiness on feet  Other symptoms and signs involving the nervous system  Abnormal posture  Muscle weakness (generalized)  Other lack of coordination  Other symptoms and signs involving the musculoskeletal system  Rationale for Evaluation and Treatment: Rehabilitation  SUBJECTIVE:   SUBJECTIVE STATEMENT: I started taking the carbidopa-levodopa yesterday.  Pt accompanied by: self   PERTINENT HISTORY: Pt is an 83 year old man who has had progressive worsening of gait, orthostatic hypotension, and a right arm  tremor. Concerns noted at most recent office visit with [facility] on 07/20/23 for Parkinson's but did not fulfill enough criteria at that time (normal muscle tone, no gait retropulsion, no definite bradykinesia). Noted less physical endurance and worsening RUE tremor when completing tasks such as toileting, holding feeding utensils, and writing. Pt was receiving SLP services at this location from 07/03/23-09/08/23  PRECAUTIONS: Fall  WEIGHT BEARING RESTRICTIONS: No  PAIN:  Are you having pain? No  FALLS: Has patient fallen in last 6 months? No  LIVING ENVIRONMENT: Lives with: lives with their spouse Lives in: [place] home, level entry, 5 STE to bedroom on left side going down and rail, den 8 on left side going down Has following equipment at home: Grab bars in walk in shower, high toilet seat  PLOF: Independentneeds some help with buttons, jackets, slower with self feeding, more difficulty recently with wiping  PATIENT GOALS: I would like to improve my handwriting and strengthen my R arm.   OBJECTIVE:  Note: Objective measures were completed at Evaluation unless otherwise noted.  HAND DOMINANCE: Right  ADLs: Overall ADLs: I/Mod I with ADLs, reported increased time with buttons and putting on jacket Transfers/ambulation related to ADLs: Eating: slower eating Grooming: I UB Dressing: difficulty/slower w/ buttons LB Dressing: I Toileting: slightly slower with wiping  Bathing: I/Mod I Tub Shower transfers:  I Equipment: Grab bars and Walk in shower  IADLs: Shopping: I Light housekeeping: I Meal Prep: spouse does cooking, pt cleans Community mobility: I Medication management: Mod I with pill organizer, no difficulty manipulating pills Financial management: I Handwriting: 95-100% legible in print, Mild micrographia, Moderate micrographia, and some spacing issues noted as the words are fairly close together. Reports writing print and micrographia getting worse  with fatigue.  MOBILITY STATUS: Independent is still driving, some stiffness in neck reported. Slower getting seatbelt on per spouse report.   POSTURE COMMENTS:  rounded shoulders and forward head Sitting balance: WFL  ACTIVITY TOLERANCE: Activity tolerance: Reports some increased fatigue, unsure if d/t old age or Parkinson's.  FUNCTIONAL OUTCOME MEASURES: Simulated eating (PPT#2) in R dominant hand: 11.70 seconds Simulated donning/doffing jacket (PPT #4): 20.85 seconds  3 button/unbutton: 27.28 seconds   UPPER EXTREMITY ROM:  WNL, some cueing required to fully extend elbows when flexing BUEs to 90 degrees.  Active ROM Right eval Left eval  Shoulder flexion    Shoulder abduction    Shoulder adduction    Shoulder extension    Shoulder internal rotation    Shoulder external rotation    Elbow flexion    Elbow extension    Wrist flexion    Wrist extension    Wrist ulnar deviation    Wrist radial deviation    Wrist pronation    Wrist supination    (Blank rows = not tested)  UPPER EXTREMITY MMT:   LUE WNL  MMT Right eval Left eval  Shoulder flexion 4   Shoulder abduction    Shoulder adduction    Shoulder extension    Shoulder internal rotation    Shoulder external rotation    Middle trapezius    Lower trapezius    Elbow flexion 4   Elbow extension    Wrist flexion    Wrist extension    Wrist ulnar deviation    Wrist radial deviation    Wrist pronation    Wrist supination    (Blank rows = not tested)  HAND FUNCTION: Grip strength: Right: 57.3 lbs; Left: 52.6 lbs  COORDINATION: 9 Hole Peg test: Right: 24.80 sec; Left: 33.96 sec Box and Blocks:  Right 49 blocks, Left 50 blocks- noted pt at times did not clear partition. Tremors: Resting and Right Simulated eating (PPT#2) in R dominant hand: 11.70 seconds Simulated donning/doffing jacket (PPT #4): 20.85 seconds  3 button/unbutton: 27.28 seconds   SENSATION: WFL  EDEMA: swelling in B ankles, PT  aware.  MUSCLE TONE: RUE: slight catch, minimal cogwheel ridigity   COGNITION: Overall cognitive status: Within functional limits for tasks assessed  VISION: Subjective report: Slight changes in vision over the years.  Baseline vision: Wears glasses all the time Visual history: cataract surgery  VISION ASSESSMENT: WFL  Patient has difficulty with following activities due to following visual impairments:  PERCEPTION: Not tested  PRAXIS: Not tested  OBSERVATIONS: Concerns with handwriting noted and some slight weakness noted in RUE. Some stiffness noted as well and mild cogwheel rigidity.  TREATMENT DATE: 11/12/23  BP seated: 149/67  Pt started PD meds yesterday - reviewed medication schedule and avoiding food with meal times including: taking medication 30 min before a meal and at least 1 hour after a meal to be most effective.   Reviewed PWR! Seated (basic 4) extensively w/ most cues needed for PWR! Rock to lean over but not twist body, fully extend elbow and look up at hand/fingers. Pt also required cues to move big w/ PWR! Twist, and for counting sequence with PWR! Step  Reviewed handwriting strategies, and ADL strategies for opening containers, putting on seatbelt (turning to look at seatbelt for more trunk rotation), and hooking/unhooking buttons. Pt practiced buttons after education w/ greater success. Pt still reports difficulty with dress shirts - recommended stretching hole first and performing PWR! Push with hands prior to buttons.   Also basic PD education on getting 30 min of intense exercise everyday and how to break up ex's for morning and afternoon more feasibly and for realistic carryover (will issue PD ex chart in future session).  Pt encouraged to stay social and active including: singing in the choir  Pt also encouraged to swing arms when  walking   PATIENT EDUCATION: Education details: see above Person educated: Patient and Spouse Education method: Explanation, Demonstration, Verbal cues, and Handouts Education comprehension: verbalized understanding, returned demonstration, verbal cues required, and needs further education   HEP's 10/06/23: PWR! Sitting 10/12/23: PWR! Hands and coordination HEP  10/23/23: Handwriting strategies 11/12/23: ADL strategies  GOALS: Goals reviewed with patient? Yes  SHORT TERM GOALS: Target date: 10/29/23 GOALS:  SHORT TERM GOALS: Target date: 10/29/23    Pt will be independent with PD specific HEP.  Baseline: not yet initiated Goal status: IN PROGRESS  2.  Pt will verbalize understanding of adapted strategies to maximize safety and independence with ADLs/IADLs.  Baseline: not yet initiated Goal status: IN PROGRESS   3.  Pt will write a sentence with no significant decrease in size and maintain 95% legibility and spacing.   Baseline: 95-100% legibility Goal status: MET  4.  Pt will demonstrate improved fine motor coordination for ADLs as evidenced by decreasing 9 hole peg test score for L hand by at least 5 seconds.  Baseline: 33.96 seconds Goal status: IN PROGRESS  5.  Pt will demonstrate improved ease with fastening buttons as evidenced by decreasing 3 button/unbutton time by at least 25 seconds.  Baseline: 27.85 seconds Goal status: INITIAL  6.  Pt will be able to place at least 3 or more blocks using bilateral hand with completion of Box and Blocks test.  Baseline: 49 R hand, 50 L hand blocks Goal status: INITIAL   LONG TERM GOALS: Target date: 11/29/23   Pt will verbalize understanding of ways to prevent future PD related complications and PD community resources.  Baseline: not yet initiated Goal status: INITIAL  2.  Pt will write a short paragraph with no significant decrease in size and maintain 100% legibility.  Baseline: 95-100% legibility with spacing  concerns Goal status: INITIAL  3.  Pt will verbalize understanding of ways to keep thinking skills sharp and ways to compensate for STM changes in the future.  Baseline: not yet initiated Goal status: INITIAL  4.  Pt will demonstrate increased ease with dressing as evidenced by decreasing PPT#4 (don/ doff jacket) to 16 secs or less.  Baseline: 20.85 seconds Goal status: INITIAL  5. Pt will improve RUE shoulder and elbow flexion strength to 5/5 for improved  ability to complete functional transfers and complete dressing tasks.   Baseline: 4/5   Goal status: INITIAL        ASSESSMENT:  CLINICAL IMPRESSION: Patient with improvements in PWR! Moves after review today and increased awareness into PD symptoms and ways to manage better. Pt continues to benefit from skilled OT services in the outpatient setting to work on impairments as noted below to help pt return to PLOF as able.    PERFORMANCE DEFICITS: in functional skills including ADLs, coordination, tone, strength, Fine motor control, and tremors.   IMPAIRMENTS: are limiting patient from ADLs and IADLs.   CO-MORBIDITIES: may have co-morbidities  that affects occupational performance. Patient will benefit from skilled OT to address above impairments and improve overall function.  MODIFICATION OR ASSISTANCE TO COMPLETE EVALUATION: Min-Moderate modification of tasks or assist with assess necessary to complete an evaluation.  OT OCCUPATIONAL PROFILE AND HISTORY: Detailed assessment: Review of records and additional review of physical, cognitive, psychosocial history related to current functional performance.  CLINICAL DECISION MAKING: Moderate - several treatment options, min-mod task modification necessary  REHAB POTENTIAL: Good  EVALUATION COMPLEXITY: Moderate    PLAN:  OT FREQUENCY: 2x/week  OT DURATION: 8 weeks +eval  PLANNED INTERVENTIONS: 97168 OT Re-evaluation, 97535 self care/ADL training, 02889 therapeutic  exercise, 97530 therapeutic activity, 97112 neuromuscular re-education, 97140 manual therapy, 97039 fluidotherapy, 97010 moist heat, 97760 Orthotic Initial, 97763 Orthotic/Prosthetic subsequent, functional mobility training, visual/perceptual remediation/compensation, energy conservation, coping strategies training, patient/family education, and DME and/or AE instructions  RECOMMENDED OTHER SERVICES: none  CONSULTED AND AGREED WITH PLAN OF CARE: Patient and family member/caregiver  PLAN FOR NEXT SESSION: Monitor BP, further review ADL strategies  (already issued handout), consider sh flexion and some PWR! Moves supine for am  Following session: issue PD ex chart and ways to prevent future PD related complications, assess STG's  (**Will need resubmission to [facility] for approval to go beyond 11/24/23)   Burnard JINNY Roads, OT 11/12/2023, 11:07 AM

## 2023-11-13 ENCOUNTER — Ambulatory Visit: Payer: Self-pay | Admitting: Cardiovascular Disease

## 2023-11-17 ENCOUNTER — Ambulatory Visit: Payer: Self-pay | Admitting: Physical Therapy

## 2023-11-17 ENCOUNTER — Ambulatory Visit: Attending: Internal Medicine | Admitting: Occupational Therapy

## 2023-11-17 VITALS — BP 143/61 | HR 81

## 2023-11-17 DIAGNOSIS — R278 Other lack of coordination: Secondary | ICD-10-CM | POA: Diagnosis present

## 2023-11-17 DIAGNOSIS — R29898 Other symptoms and signs involving the musculoskeletal system: Secondary | ICD-10-CM | POA: Diagnosis present

## 2023-11-17 DIAGNOSIS — R2681 Unsteadiness on feet: Secondary | ICD-10-CM | POA: Insufficient documentation

## 2023-11-17 DIAGNOSIS — R29818 Other symptoms and signs involving the nervous system: Secondary | ICD-10-CM | POA: Diagnosis present

## 2023-11-17 DIAGNOSIS — M6281 Muscle weakness (generalized): Secondary | ICD-10-CM

## 2023-11-17 DIAGNOSIS — R293 Abnormal posture: Secondary | ICD-10-CM | POA: Diagnosis present

## 2023-11-17 NOTE — Therapy (Addendum)
 OUTPATIENT PHYSICAL THERAPY NEURO TREATMENT   Patient Name: Carl Nichols MRN: [id number] DOB:October 29, 1940, 83 y.o., male Today's Date: 11/17/2023   PCP: Carl Santos, MD  REFERRING PROVIDER: [name] Santos, MD  END OF SESSION:  PT End of Session - 11/17/23 1104     Visit Number 5    Number of Visits 9    Date for Recertification  12/10/23   per re-cert on 89/71/74   Authorization Type [employer]IC[employer]    PT Start Time 1016    PT Stop Time 1100    PT Time Calculation (min) 44 min    Equipment Utilized During Treatment Gait belt    Activity Tolerance Patient tolerated treatment well    Behavior During Therapy WFL for tasks assessed/performed            Past Medical History:  Diagnosis Date   BPH (benign prostatic hyperplasia)    Chronic kidney disease    Dizziness    Hyperlipidemia    Pneumonia    march 2018   Stage III chronic kidney disease (HCC)    Past Surgical History:  Procedure Laterality Date   CATARACT EXTRACTION Right    jan 2013   CATARACT EXTRACTION Left    feb 2013   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   ONSET DATE: 08/28/2023  REFERRING DIAG: R26.0 (ICD-10-CM) - Staggering gait  THERAPY DIAG:  Abnormal posture  Muscle weakness (generalized)  Other lack of coordination  Rationale for Evaluation and Treatment: Rehabilitation  SUBJECTIVE:                                                                                                                                                                                             SUBJECTIVE STATEMENT: Patient ambulates in without AD. Patient states feeling good and denies falls. Stated he is working on increasing Carbidopa Levodopa dosage over the weeks as prescribed.   Pt accompanied by: Wife, Carl Nichols   PERTINENT HISTORY: PMH: HLD,  BPH  R arm tremors and difficulty with gait  Orthostatic hypotension   He feels symptoms started about 6 months ago and has mildly worsened. His wife notes his gait has slowed over the past 6 months.   PAIN:  Are you having pain? No  PRECAUTIONS: Other: Orthostatic hypotension    FALLS: Has patient fallen in last 6 months? No  LIVING ENVIRONMENT: Lives with: lives with their spouse  Lives in: House/apartment Stairs: Yes: Internal: 5 up and 8 down, lives in a tri-level steps; on right going up and External: 2 steps; on left going up Has following equipment at home: Single point cane, Grab bars, and does not use it   PLOF: Independent and Leisure: active in the church, puttering in the yard   PATIENT GOALS: wants to deal with Parkinson's thinks his R hand has gotten a little worse   OBJECTIVE:  Note: Objective measures were completed at Evaluation unless otherwise noted.  DIAGNOSTIC FINDINGS: MRI brain 08/13/23: IMPRESSION: Normal  COGNITION: Overall cognitive status: Within functional limits for tasks assessed   SENSATION: Light touch: WFL, but pt reporting inside of his calf felt different than the outside   COORDINATION: Heel to shin: slightly bradykinetic with RLE   OBSERVATION: RUE tremor   EDEMA:  Mild swelling to bilateral ankles   POSTURE: rounded shoulders, forward head, and increased thoracic kyphosis  LOWER EXTREMITY ROM:    Decr knee extension AROM   LOWER EXTREMITY MMT:    MMT Right Eval Left Eval  Hip flexion 4+ 5  Hip extension    Hip abduction 4 4  Hip adduction 5 5  Hip internal rotation    Hip external rotation    Knee flexion 5 5  Knee extension 5 4+  Ankle dorsiflexion    Ankle plantarflexion    Ankle inversion    Ankle eversion    (Blank rows = not tested)  All tested in sitting   BED MOBILITY:  Pt reports slower movements getting in and out of the bed   TRANSFERS: Sit to stand: Modified independence  Assistive device  utilized: None     Stand to sit: Modified independence  Assistive device utilized: None      Pt with decr forward lean   GAIT: Findings: Gait Characteristics: step through pattern, decreased arm swing- Right, decreased arm swing- Left, decreased stride length, decreased trunk rotation, and trunk flexed, Distance walked: Clinic distances , Assistive device utilized:None, Level of assistance: Modified independence, and Comments: incr forward head, decr arm swing RUE>LUE  Pt reports his walking feels different, not as relaxed and comfortable   FUNCTIONAL TESTS:  5 times sit to stand: 11.1 seconds with no UE support  10 meter walk test: 9.5 seconds with no AD = 3.45 ft/sec                                                                                                                                TREATMENT DATE: 11/17/23  Therapeutic Activity:  Vitals:   11/17/23 1019 11/17/23 1021  BP: (!) 169/68 (!) 143/61  Pulse: 65 81    BP taken in sitting, then standing.  Pt wearing compression socks, drinking water, and reports no symptoms in standing. Pt performs seated chest opening PWR! Moves with half foam roll for improved thoracic extension, 2 sets x 10 reps Pt performs sit to stand with  PWR! Move for posture in standing position with a chair in front of him as needed for balance x 10 reps Sit to stand with ball catch and throw x 10 reps Sit to stand with banded resistance and ball catch/throw x 10 reps  *cue for looking up with chest opening, posterior resistance-cueing bottom back and weight shifting forward with sit to stand  NMR: At bar and mirror: Lateral high hurdles (1 hurdle step right and left) x 12 total Lateral high hurdles w/foam pad in center (1 hurdle step right and left), 2 sets x 12 total On foam, 1 minute opposite reaching blazepod hits (4), 3 x 1 minute rounds *CGA needed throughout with some correction needed with LOB, Cueing on keeping foot on foam pad during  reaching task, using big reaching, using contralateral side for reaching  PATIENT EDUCATION: Education details: Continue HEP and review for proper form Person educated: Patient and Spouse Education method: Explanation, Demonstration, and Handouts Education comprehension: verbalized understanding and returned demonstration  HOME EXERCISE PROGRAM: Standing PWR Moves   GOALS: Goals reviewed with patient? Yes  SHORT TERM GOALS:  ALL STGS = LTGS   LONG TERM GOALS: Target date: 10/27/2023 UPDATED LTG DATE FOR RE-CERT:   Pt will be independent with final HEP for strength, gait, balance, posture in order to build upon functional gains made in therapy. Baseline:  Goal status: INITIAL  2.  Pt will improve miniBEST to at least a 25/28 in order to demo decr fall risk.  Baseline: 23/28 Goal status: INITIAL  3.  Pt will verbalize understanding of transition to appropriate community fitness classes upon d/c from PT to maximize gains made in therapy.  Baseline:  Goal status: INITIAL    ASSESSMENT:  CLINICAL IMPRESSION:  Today's skilled physical therapy session included LE strengthening and dynamic balance with emphasis on big movements of arms and legs for improved clearance of obstacles and improved sit to stand. Emphasis on patient weight shifting forward during sit to stand and resisting posterior movement during banded sit to stand. Patient was challenged by resisted strengthening and lateral obstacles/hurdles at the bar but improved performance with repetitions. Patient continues to need cueing on weight shifting during sit to standing. Patient continues to benefit from skilled physical therapy in order to decrease above mentioned impairments and decrease risk of falls. Will continue per POC.  .   OBJECTIVE IMPAIRMENTS: Abnormal gait, decreased activity tolerance, decreased balance, decreased coordination, difficulty walking, decreased ROM, decreased strength, impaired flexibility, and  postural dysfunction.   ACTIVITY LIMITATIONS: bending, transfers, bed mobility, and locomotion level  PARTICIPATION LIMITATIONS: community activity  PERSONAL FACTORS: Age, Behavior pattern, Past/current experiences, Time since onset of injury/illness/exacerbation, and 1-2 comorbidities: HLD, BPH are also affecting patient's functional outcome.   REHAB POTENTIAL: Good  CLINICAL DECISION MAKING: Evolving/moderate complexity  EVALUATION COMPLEXITY: Moderate  PLAN:  PT FREQUENCY: 2x/week  PT DURATION: 6 weeks - due to delay in scheduling, anticipate just 8 PT visits for 4 weeks   PLANNED INTERVENTIONS: 97164- PT Re-evaluation, 97110-Therapeutic exercises, 97530- Therapeutic activity, 97112- Neuromuscular re-education, 97535- Self Care, 02859- Manual therapy, [phone]- Gait training, Patient/Family education, Balance training, and Stair training  PLAN FOR NEXT SESSION:  SciFit, posture (would benefit from any prone exercises), balance - SLS, unlevel surfaces  Work on reciprocal arm swing with gait, incr forward lean with sit <> stands  Ideas: Add cognitive tasks like naming fruits with alphabet, stepping from foam in accordance to color said and direction written (use paper), rocker board  cone taps.    Emmalene Sherry, Student-PT, DPT 11/17/2023, 1:06 PM

## 2023-11-17 NOTE — Therapy (Signed)
 OUTPATIENT OCCUPATIONAL THERAPY NEURO TREATMENT  Patient Name: Carl Nichols MRN: [id number] DOB:1940-12-19, 83 y.o., male Today's Date: 11/17/2023  PCP: [name] Santos, MD REFERRING PROVIDER: [name] Santos, MD  END OF SESSION:  OT End of Session - 11/17/23 0937     Visit Number 7    Number of Visits 17    Date for Recertification  11/29/23    Authorization Type [employer] MCR approved 8 O.T. visits 9/16 - 11/24/23    Authorization Time Period visit limit MN    Progress Note Due on Visit 10    OT Start Time 0933    OT Stop Time 1015    OT Time Calculation (min) 42 min    Activity Tolerance Patient tolerated treatment well    Behavior During Therapy [facility] for tasks assessed/performed          Past Medical History:  Diagnosis Date   BPH (benign prostatic hyperplasia)    Chronic kidney disease    Dizziness    Hyperlipidemia    Pneumonia    march 2018   Stage III chronic kidney disease (HCC)    Past Surgical History:  Procedure Laterality Date   CATARACT EXTRACTION Right    jan 2013   CATARACT EXTRACTION Left    feb 2013   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   ONSET DATE: 09/07/2023  REFERRING DIAG: R29.898 (ICD-10-CM) - Other symptoms and signs involving the musculoskeletal system  THERAPY DIAG:  Abnormal posture  Muscle weakness (generalized)  Other symptoms and signs involving the nervous system  Other lack of coordination  Other symptoms and signs involving the musculoskeletal system  Rationale for Evaluation and Treatment: Rehabilitation  SUBJECTIVE:   SUBJECTIVE STATEMENT: I have noticed my hearing, smell, and taste have improved. Wife also noticed improved posture.  Pt accompanied by: self   PERTINENT HISTORY: Pt is an 83 year old man who has had progressive worsening of gait, orthostatic hypotension,  and a right arm tremor. Concerns noted at most recent office visit with [facility] on 07/20/23 for Parkinson's but did not fulfill enough criteria at that time (normal muscle tone, no gait retropulsion, no definite bradykinesia). Noted less physical endurance and worsening RUE tremor when completing tasks such as toileting, holding feeding utensils, and writing. Pt was receiving SLP services at this location from 07/03/23-09/08/23  PRECAUTIONS: Fall  WEIGHT BEARING RESTRICTIONS: No  PAIN:  Are you having pain? No  FALLS: Has patient fallen in last 6 months? No  LIVING ENVIRONMENT: Lives with: lives with their spouse Lives in: [place] home, level entry, 5 STE to bedroom on left side going down and rail, den 8 on left side going down Has following equipment at home: Grab bars in walk in shower, high toilet seat  PLOF: Independentneeds some help with buttons, jackets, slower with self feeding, more difficulty recently with wiping  PATIENT GOALS: I would like to improve my handwriting and strengthen my R arm.   OBJECTIVE:  Note: Objective measures were completed at Evaluation unless otherwise noted.  HAND DOMINANCE: Right  ADLs: Overall ADLs: I/Mod I with ADLs, reported increased time with buttons and putting on jacket Transfers/ambulation related to ADLs: Eating: slower eating Grooming: I UB Dressing: difficulty/slower w/ buttons LB Dressing: I Toileting: slightly slower with wiping  Bathing:  I/Mod I Tub Shower transfers: I Equipment: Grab bars and Walk in shower  IADLs: Shopping: I Light housekeeping: I Meal Prep: spouse does cooking, pt cleans Community mobility: I Medication management: Mod I with pill organizer, no difficulty manipulating pills Financial management: I Handwriting: 95-100% legible in print, Mild micrographia, Moderate micrographia, and some spacing issues noted as the words are fairly close together. Reports writing print and micrographia  getting worse with fatigue.  MOBILITY STATUS: Independent is still driving, some stiffness in neck reported. Slower getting seatbelt on per spouse report.   POSTURE COMMENTS:  rounded shoulders and forward head Sitting balance: WFL  ACTIVITY TOLERANCE: Activity tolerance: Reports some increased fatigue, unsure if d/t old age or Parkinson's.  FUNCTIONAL OUTCOME MEASURES: Simulated eating (PPT#2) in R dominant hand: 11.70 seconds Simulated donning/doffing jacket (PPT #4): 20.85 seconds  3 button/unbutton: 27.28 seconds   UPPER EXTREMITY ROM:  WNL, some cueing required to fully extend elbows when flexing BUEs to 90 degrees.  Active ROM Right eval Left eval  Shoulder flexion    Shoulder abduction    Shoulder adduction    Shoulder extension    Shoulder internal rotation    Shoulder external rotation    Elbow flexion    Elbow extension    Wrist flexion    Wrist extension    Wrist ulnar deviation    Wrist radial deviation    Wrist pronation    Wrist supination    (Blank rows = not tested)  UPPER EXTREMITY MMT:   LUE WNL  MMT Right eval Left eval  Shoulder flexion 4   Shoulder abduction    Shoulder adduction    Shoulder extension    Shoulder internal rotation    Shoulder external rotation    Middle trapezius    Lower trapezius    Elbow flexion 4   Elbow extension    Wrist flexion    Wrist extension    Wrist ulnar deviation    Wrist radial deviation    Wrist pronation    Wrist supination    (Blank rows = not tested)  HAND FUNCTION: Grip strength: Right: 57.3 lbs; Left: 52.6 lbs  COORDINATION: 9 Hole Peg test: Right: 24.80 sec; Left: 33.96 sec Box and Blocks:  Right 49 blocks, Left 50 blocks- noted pt at times did not clear partition. Tremors: Resting and Right Simulated eating (PPT#2) in R dominant hand: 11.70 seconds Simulated donning/doffing jacket (PPT #4): 20.85 seconds  3 button/unbutton: 27.28 seconds   SENSATION: WFL  EDEMA: swelling in B  ankles, PT aware.  MUSCLE TONE: RUE: slight catch, minimal cogwheel ridigity   COGNITION: Overall cognitive status: Within functional limits for tasks assessed  VISION: Subjective report: Slight changes in vision over the years.  Baseline vision: Wears glasses all the time Visual history: cataract surgery  VISION ASSESSMENT: WFL  Patient has difficulty with following activities due to following visual impairments:  PERCEPTION: Not tested  PRAXIS: Not tested  OBSERVATIONS: Concerns with handwriting noted and some slight weakness noted in RUE. Some stiffness noted as well and mild cogwheel rigidity.  TREATMENT DATE: 11/17/23  Pt issued shoulder flexion exercise supine, and PWR! Up, Twist, and Step in supine. Pt performed 10-20 reps w/ min cues for large amplitude movements and elbow extension (when needed). Recommended pt do these every morning before getting out of bed.   Pt also issued PD ex chart and reviewed which ex's to do which days for greater ease and carryover at home. Also helped pt organize notebook    PATIENT EDUCATION: Education details: see above Person educated: Patient and Spouse Education method: Explanation, Demonstration, Verbal cues, and Handouts Education comprehension: verbalized understanding, returned demonstration, verbal cues required, and needs further education   HEP's 10/06/23: PWR! Sitting 10/12/23: PWR! Hands and coordination HEP  10/23/23: Handwriting strategies 11/12/23: ADL strategies 11/17/23: PWR! Up, Twist, Step in Supine, sh flex (supine), PD ex chart  GOALS: Goals reviewed with patient? Yes  SHORT TERM GOALS: Target date: 10/29/23 GOALS:  SHORT TERM GOALS: Target date: 10/29/23    Pt will be independent with PD specific HEP.  Baseline: not yet initiated Goal status: MET  2.  Pt will verbalize  understanding of adapted strategies to maximize safety and independence with ADLs/IADLs.  Baseline: not yet initiated Goal status: IN PROGRESS   3.  Pt will write a sentence with no significant decrease in size and maintain 95% legibility and spacing.   Baseline: 95-100% legibility Goal status: MET  4.  Pt will demonstrate improved fine motor coordination for ADLs as evidenced by decreasing 9 hole peg test score for L hand by at least 5 seconds.  Baseline: 33.96 seconds Goal status: IN PROGRESS  5.  Pt will demonstrate improved ease with fastening buttons as evidenced by decreasing 3 button/unbutton time by at least 25 seconds.  Baseline: 27.85 seconds Goal status: INITIAL  6.  Pt will be able to place at least 3 or more blocks using bilateral hand with completion of Box and Blocks test.  Baseline: 49 R hand, 50 L hand blocks Goal status: INITIAL   LONG TERM GOALS: Target date: 11/29/23   Pt will verbalize understanding of ways to prevent future PD related complications and PD community resources.  Baseline: not yet initiated Goal status: INITIAL  2.  Pt will write a short paragraph with no significant decrease in size and maintain 100% legibility.  Baseline: 95-100% legibility with spacing concerns Goal status: INITIAL  3.  Pt will verbalize understanding of ways to keep thinking skills sharp and ways to compensate for STM changes in the future.  Baseline: not yet initiated Goal status: INITIAL  4.  Pt will demonstrate increased ease with dressing as evidenced by decreasing PPT#4 (don/ doff jacket) to 16 secs or less.  Baseline: 20.85 seconds Goal status: INITIAL  5. Pt will improve RUE shoulder and elbow flexion strength to 5/5 for improved ability to complete functional transfers and complete dressing tasks.   Baseline: 4/5   Goal status: INITIAL        ASSESSMENT:  CLINICAL IMPRESSION: Patient with improvements in function and posture w/ PD HEP  routine and medication added last week. Pt continues to benefit from skilled OT services in the outpatient setting to work on impairments as noted below to help pt return to PLOF as able.    PERFORMANCE DEFICITS: in functional skills including ADLs, coordination, tone, strength, Fine motor control, and tremors.   IMPAIRMENTS: are limiting patient from ADLs and IADLs.   CO-MORBIDITIES: may have co-morbidities  that affects occupational performance. Patient will benefit from skilled OT to  address above impairments and improve overall function.  MODIFICATION OR ASSISTANCE TO COMPLETE EVALUATION: Min-Moderate modification of tasks or assist with assess necessary to complete an evaluation.  OT OCCUPATIONAL PROFILE AND HISTORY: Detailed assessment: Review of records and additional review of physical, cognitive, psychosocial history related to current functional performance.  CLINICAL DECISION MAKING: Moderate - several treatment options, min-mod task modification necessary  REHAB POTENTIAL: Good  EVALUATION COMPLEXITY: Moderate    PLAN:  OT FREQUENCY: 2x/week  OT DURATION: 8 weeks +eval  PLANNED INTERVENTIONS: 97168 OT Re-evaluation, 97535 self care/ADL training, 02889 therapeutic exercise, 97530 therapeutic activity, 97112 neuromuscular re-education, 97140 manual therapy, 97039 fluidotherapy, 97010 moist heat, 97760 Orthotic Initial, 97763 Orthotic/Prosthetic subsequent, functional mobility training, visual/perceptual remediation/compensation, energy conservation, coping strategies training, patient/family education, and DME and/or AE instructions  RECOMMENDED OTHER SERVICES: none  CONSULTED AND AGREED WITH PLAN OF CARE: Patient and family member/caregiver  PLAN FOR NEXT SESSION: Monitor BP, further review ADL strategies  (already issued handout),  issue ways to prevent future PD related complications, assess STG's  (**Will need resubmission to [facility] for approval to go beyond 11/24/23)    Burnard JINNY Roads, OT 11/17/2023, 9:39 AM

## 2023-11-17 NOTE — Patient Instructions (Addendum)
(  Exercise) Monday Tuesday Wednesday Thursday Friday Saturday Sunday   PWR! Supine and shoulder flexion           PWR! Hands           PWR! Sitting           PWR! Standing           Coordination           Speech ex's           Gym/bicycle                                           Cranial Flexion: Overhead Arm Extension - Supine ([facility] [place])    Lie with knees bent, arms beyond head, holding paper towel roll or empty shoe box. Pull ball up to above face. Repeat __10__ times per set. Do 1__ set per [place]

## 2023-11-20 ENCOUNTER — Ambulatory Visit: Payer: Self-pay | Admitting: Physical Therapy

## 2023-11-20 ENCOUNTER — Ambulatory Visit: Admitting: Occupational Therapy

## 2023-11-20 VITALS — BP 142/60 | HR 87

## 2023-11-20 DIAGNOSIS — R293 Abnormal posture: Secondary | ICD-10-CM

## 2023-11-20 DIAGNOSIS — R2681 Unsteadiness on feet: Secondary | ICD-10-CM

## 2023-11-20 DIAGNOSIS — R29818 Other symptoms and signs involving the nervous system: Secondary | ICD-10-CM

## 2023-11-20 DIAGNOSIS — R278 Other lack of coordination: Secondary | ICD-10-CM

## 2023-11-20 DIAGNOSIS — R29898 Other symptoms and signs involving the musculoskeletal system: Secondary | ICD-10-CM

## 2023-11-20 DIAGNOSIS — M6281 Muscle weakness (generalized): Secondary | ICD-10-CM

## 2023-11-20 NOTE — Patient Instructions (Addendum)
 BUTTONING  Open hands big before fastening or unfastening each button. Use deliberate movements - angry buttons.  Button by using push-pull method - push button through hole with one hand and pull the fabric back with the other hand.   Unfasten by using pull-push method - pull the fabric back with one hand and push the button through with the other.       Access Code: [email or web address] URL: [email or web address] Date: 11/20/2023 Prepared by: [email or web address] Marieclaire Bettenhausen  Exercises - Putty Squeezes  - 1-2 x daily - 10 reps - Rolling Putty on Table  - 1-2 x daily - 10 reps - Finger Pinch and Pull with Putty  - 1-2 x daily - 10 reps - 3-Point Pinch with Putty  - 1-2 x daily - 10 reps - Tip PUSH with Putty  - 1-2 x daily - 10 reps - Key Pinch with Putty  - 1-2 x daily - 10 reps - Finger Extension with Putty  - 1-2 x daily - 10 reps - Finger Adduction with Putty  - 1-2 x daily - 10 reps - Removing Marbles from Putty  - 1-2 x daily - 10 reps

## 2023-11-20 NOTE — Therapy (Signed)
 OUTPATIENT PHYSICAL THERAPY NEURO TREATMENT   Patient Name: Murry Diaz Loredo MRN: [id number] DOB:11-07-1940, 83 y.o., male Today's Date: 11/20/2023   PCP: [name] Santos, MD  REFERRING PROVIDER: [name] Santos, MD  END OF SESSION:  PT End of Session - 11/20/23 1201     Visit Number 6    Number of Visits 9    Date for Recertification  12/10/23   per re-cert on 89/71/74   Authorization Type [employer]    PT Start Time 1019    PT Stop Time 1100    PT Time Calculation (min) 41 min    Equipment Utilized During Treatment Gait belt    Activity Tolerance Patient tolerated treatment well    Behavior During Therapy WFL for tasks assessed/performed             Past Medical History:  Diagnosis Date   BPH (benign prostatic hyperplasia)    Chronic kidney disease    Dizziness    Hyperlipidemia    Pneumonia    march 2018   Stage III chronic kidney disease (HCC)    Past Surgical History:  Procedure Laterality Date   CATARACT EXTRACTION Right    jan 2013   CATARACT EXTRACTION Left    feb 2013   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   ONSET DATE: 08/28/2023  REFERRING DIAG: R26.0 (ICD-10-CM) - Staggering gait  THERAPY DIAG:  Muscle weakness (generalized)  Abnormal posture  Unsteadiness on feet  Other symptoms and signs involving the nervous system  Rationale for Evaluation and Treatment: Rehabilitation  SUBJECTIVE:                                                                                                                                                                                             SUBJECTIVE STATEMENT: Patient ambulates into clinic without AD and wife accompanying him. Patient states that he might get a stationary bike for at home biking and he is trying to figure out the best one to  get.   Pt accompanied by: Wife, [name]   PERTINENT HISTORY: PMH: HLD, BPH  R arm tremors and difficulty with gait  Orthostatic hypotension   He feels symptoms started about 6 months ago and has mildly worsened. His wife notes his gait has slowed over the past 6 months.   PAIN:  Are you having pain? No  PRECAUTIONS: Other: Orthostatic hypotension  FALLS: Has patient fallen in last 6 months? 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  DIAGNOSTIC FINDINGS: MRI brain 08/13/23: IMPRESSION: Normal  COGNITION: Overall cognitive status: Within functional limits for tasks assessed   SENSATION: Light touch: WFL, but pt reporting inside of his calf felt different than the outside   COORDINATION: Heel to shin: slightly bradykinetic with RLE   OBSERVATION: RUE tremor   EDEMA:  Mild swelling to bilateral ankles   POSTURE: rounded shoulders, forward head, and increased thoracic kyphosis  LOWER EXTREMITY ROM:    Decr knee extension AROM   LOWER EXTREMITY MMT:    MMT Right Eval Left Eval  Hip flexion 4+ 5  Hip extension    Hip abduction 4 4  Hip adduction 5 5  Hip internal rotation    Hip external rotation    Knee flexion 5 5  Knee extension 5 4+  Ankle dorsiflexion    Ankle plantarflexion    Ankle inversion    Ankle eversion    (Blank rows = not tested)  All tested in sitting   BED MOBILITY:  Pt reports slower movements getting in and out of the  bed   TRANSFERS: Sit to stand: Modified independence  Assistive device utilized: None     Stand to sit: Modified independence  Assistive device utilized: None      Pt with decr forward lean   GAIT: Findings: Gait Characteristics: step through pattern, decreased arm swing- Right, decreased arm swing- Left, decreased stride length, decreased trunk rotation, and trunk flexed, Distance walked: Clinic distances , Assistive device utilized:None, Level of assistance: Modified independence, and Comments: incr forward head, decr arm swing RUE>LUE  Pt reports his walking feels different, not as relaxed and comfortable   FUNCTIONAL TESTS:  5 times sit to stand: 11.1 seconds with no UE support  10 meter walk test: 9.5 seconds with no AD = 3.45 ft/sec                                                                                                                                TREATMENT DATE: 11/20/23  Therapeutic Activity:  Vitals:   11/20/23 1029 11/20/23 1031  BP: (!) 159/73 (!) 142/60  Pulse: 74 87     BP taken in sitting, then standing.  Pt wearing compression socks, drinking water, and continues to report no symptoms in standing. Pt performs seated chest opening  PWR! Moves with half foam roll for improved thoracic extension x 15 reps Pt performs sit to stand with purple resistance band, 2 sets x 10 reps, challenging, cues needs for anterior chest/weight shifting and hip flexion for improved posterior weight shifting during eccentric sitting  NMR: 2 sets x 3 minutes, At bar and mirror, blazepod hits, disctracting colors for dual tasking, step overs with lateral hurdles on compliant surface, min assist needed throughout: Instructions: can only hit focus pod (blue) when standing in middle on foam, then step over lateral hurdle alternating to each side Lateral high hurdles w/foam pad in center (1 hurdle step right and left) On foam, 1 minute opposite reaching blazepod hits (4) *CGA needed  throughout with some correction needed with LOB, Cueing on keeping foot on foam pad during reaching task, using big reaching, using contralateral side for reaching  PATIENT EDUCATION: Education details: Continue HEP, reviewing how to sit to stand and cueing for at home exercise, stationary bike for at home Person educated: Patient and Spouse Education method: Explanation, Demonstration, and Handouts Education comprehension: verbalized understanding and returned demonstration  HOME EXERCISE PROGRAM: Standing PWR Moves   GOALS: Goals reviewed with patient? Yes  SHORT TERM GOALS:  ALL STGS = LTGS   LONG TERM GOALS: Target date: 10/27/2023 UPDATED LTG DATE FOR RE-CERT:   Pt will be independent with final HEP for strength, gait, balance, posture in order to build upon functional gains made in therapy. Baseline:  Goal status: INITIAL  2.  Pt will improve miniBEST to at least a 25/28 in order to demo decr fall risk.  Baseline: 23/28 Goal status: INITIAL  3.  Pt will verbalize understanding of transition to appropriate community fitness classes upon d/c from PT to maximize gains made in therapy.  Baseline:  Goal status: INITIAL    ASSESSMENT:  CLINICAL IMPRESSION:  Today's skilled physical therapy session continued with LE strengthening and advancing dynamic balance through combining previous tasks from last session. The blazepod task included dual tasking and compliant surface with lateral hurdles which posed a challenging task for the patient due to coordinating balance with blazepod hits and distracting colors. All of treatment was well tolerated and use of dual tasks and stepping exercises for improved foot clearance should be continued. Patient did improve performance with repetition. Patient continues to need cueing on weight shifting during sit to standing. Patient continues to benefit from skilled physical therapy in order to decrease above mentioned impairments and decrease  risk of falls. Will continue per POC.  .   OBJECTIVE IMPAIRMENTS: Abnormal gait, decreased activity tolerance, decreased balance, decreased coordination, difficulty walking, decreased ROM, decreased strength, impaired flexibility, and postural dysfunction.   ACTIVITY LIMITATIONS: bending, transfers, bed mobility, and locomotion level  PARTICIPATION LIMITATIONS: community activity  PERSONAL FACTORS: Age, Behavior pattern, Past/current experiences, Time since onset of injury/illness/exacerbation, and 1-2 comorbidities: HLD, BPH are also affecting patient's functional outcome.   REHAB POTENTIAL: Good  CLINICAL DECISION MAKING: Evolving/moderate complexity  EVALUATION COMPLEXITY: Moderate  PLAN:  PT FREQUENCY: 2x/week  PT DURATION: 6 weeks - due to delay in scheduling, anticipate just 8 PT visits for 4 weeks   PLANNED INTERVENTIONS: [id number]- PT Re-evaluation, 97110-Therapeutic exercises, 97530- Therapeutic activity, 97112- Neuromuscular re-education, 97535- Self Care, [id number]- Manual therapy, [phone]- Gait training, Patient/Family education, Balance training, and Stair training  PLAN FOR NEXT SESSION:  SciFit, posture (would benefit from any prone exercises), balance - SLS, unlevel surfaces  Work on reciprocal arm swing with gait, incr forward lean with  sit <> stands  Ideas: Add cognitive tasks like naming fruits with alphabet, stepping from foam in accordance to color said and direction written (use paper), rocker board cone taps.    Emmalene Sherry, Student-PT, DPT 11/20/2023, 3:02 PM

## 2023-11-20 NOTE — Therapy (Addendum)
 OUTPATIENT OCCUPATIONAL THERAPY NEURO TREATMENT  Patient Name: Carl Nichols MRN: [id number] DOB:[birth year], 83 y.o., male Today's Date: 11/20/2023  PCP: [name] Santos, MD REFERRING PROVIDER: [name] Santos, MD  END OF SESSION:  OT End of Session - 11/20/23 1019     Visit Number 8    Number of Visits 17    Date for Recertification  11/29/23    Authorization Type [employer] MCR approved 8 O.T. visits 9/16 - 11/24/23    Authorization Time Period visit limit [place]    Progress Note Due on Visit 10    OT Start Time 0932    OT Stop Time 1019    OT Time Calculation (min) 47 min    Equipment Utilized During Treatment Green putty, wrist weights    Activity Tolerance Patient tolerated treatment well    Behavior During Therapy WFL for tasks assessed/performed         Past Medical History:  Diagnosis Date   BPH (benign prostatic hyperplasia)    Chronic kidney disease    Dizziness    Hyperlipidemia    Pneumonia    march 2018   Stage III chronic kidney disease (HCC)    Past Surgical History:  Procedure Laterality Date   CATARACT EXTRACTION Right    jan 2013   CATARACT EXTRACTION Left    feb 2013   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   ONSET DATE: 09/07/2023  REFERRING DIAG: R29.898 (ICD-10-CM) - Other symptoms and signs involving the musculoskeletal system  THERAPY DIAG:  Muscle weakness (generalized)  Other symptoms and signs involving the nervous system  Other lack of coordination  Other symptoms and signs involving the musculoskeletal system  Rationale for Evaluation and Treatment: Rehabilitation  SUBJECTIVE:   SUBJECTIVE STATEMENT: Per pt, everything is going well and there are no concerns at this time. Pt reports that he has been managing his blood pressure daily and is compliant with prescribed medications.  Pt  accompanied by: significant other   PERTINENT HISTORY: Pt is an 83 year old man who has had progressive worsening of gait, orthostatic hypotension, and a right arm tremor. Concerns noted at most recent office visit with [facility] on 07/20/23 for Parkinson's but did not fulfill enough criteria at that time (normal muscle tone, no gait retropulsion, no definite bradykinesia). Noted less physical endurance and worsening RUE tremor when completing tasks such as toileting, holding feeding utensils, and writing. Pt was receiving SLP services at this location from 07/03/23-09/08/23  PRECAUTIONS: Fall  WEIGHT BEARING RESTRICTIONS: No  PAIN:  Are you having pain? No  FALLS: Has patient fallen in last 6 months? No  LIVING ENVIRONMENT: Lives with: lives with their spouse Lives in: [place] home, level entry, 5 STE to bedroom on left side going down and rail, den 8 on left side going down Has following equipment at home: Grab bars in walk in shower, high toilet seat  PLOF: Independentneeds some help with buttons, jackets, slower with self feeding, more difficulty recently with wiping  PATIENT GOALS: I would like to improve my handwriting and strengthen my R arm.   OBJECTIVE:  Note: Objective measures were completed at Evaluation unless otherwise noted.  HAND DOMINANCE: Right  ADLs: Overall ADLs: I/Mod I with ADLs, reported increased time with buttons and putting on jacket  Transfers/ambulation related to ADLs: Eating: slower eating Grooming: I UB Dressing: difficulty/slower w/ buttons LB Dressing: I Toileting: slightly slower with wiping  Bathing: I/Mod I Tub Shower transfers: I Equipment: Grab bars and Walk in shower  IADLs: Shopping: I Light housekeeping: I Meal Prep: spouse does cooking, pt cleans Community mobility: I Medication management: Mod I with pill organizer, no difficulty manipulating pills Financial management: I Handwriting: 95-100% legible in print,  Mild micrographia, Moderate micrographia, and some spacing issues noted as the words are fairly close together. Reports writing print and micrographia getting worse with fatigue.  MOBILITY STATUS: Independent is still driving, some stiffness in neck reported. Slower getting seatbelt on per spouse report.   POSTURE COMMENTS:  rounded shoulders and forward head Sitting balance: WFL  ACTIVITY TOLERANCE: Activity tolerance: Reports some increased fatigue, unsure if d/t old age or Parkinson's.  FUNCTIONAL OUTCOME MEASURES: Simulated eating (PPT#2) in R dominant hand: 11.70 seconds Simulated donning/doffing jacket (PPT #4): 20.85 seconds  3 button/unbutton: 27.28 seconds   UPPER EXTREMITY ROM:  WNL, some cueing required to fully extend elbows when flexing BUEs to 90 degrees.  UPPER EXTREMITY MMT:   LUE WNL  MMT Right eval Left eval  Shoulder flexion 4   Elbow flexion 4   Elbow extension    (Blank rows = not tested)  HAND FUNCTION: Grip strength: Right: 57.3 lbs; Left: 52.6 lbs  COORDINATION: 9 Hole Peg test: Right: 24.80 sec; Left: 33.96 sec 11/20/23- Right: 25.4, 28.6 sec; Left: 24.9sec  Box and Blocks:  Right 49 blocks, Left 50 blocks- noted pt at times did not clear partition. 11/20/2023: Box and Blocks:  Right 52 blocks, Left 51 blocks (notable smaller movements as test went on)  Tremors: Resting and Right Simulated eating (PPT#2) in R dominant hand: 11.70 seconds Simulated donning/doffing jacket (PPT #4): 20.85 seconds  3 button/unbutton: 27.28 seconds  11/20/23: 3 button/unbutton: 23.8 seconds   SENSATION: WFL  EDEMA: swelling in B ankles, PT aware.  MUSCLE TONE: RUE: slight catch, minimal cogwheel ridigity   COGNITION: Overall cognitive status: Within functional limits for tasks assessed  VISION: Subjective report: Slight changes in vision over the years.  Baseline vision: Wears glasses all the time Visual history: cataract surgery  VISION  ASSESSMENT: WFL  Patient has difficulty with following activities due to following visual impairments:  PERCEPTION: Not tested  PRAXIS: Not tested  OBSERVATIONS: Concerns with handwriting noted and some slight weakness noted in RUE. Some stiffness noted as well and mild cogwheel rigidity.                                                                                                                           TREATMENT DATE: 11/17/23 - Self-care/home management completed for duration as noted below including:  Pt did not take BP prior to the session and expressed interest in knowing current reading. At the start of the session, OT assessed BP, which measured 174/76, consistent with pt's typical range. Pt reports  taking prescribed medication to elevate BP. In addition, pt was provided with a BP log to support ongoing tracking of BP at home. Please see pt's instructions for additional details.  In addition, OT reassessed pt's short-term goals to determine progress in fine and gross motor coordination, as well as functional performance with buttoning shirts at home. Pt demonstrated notable improvement in fine motor coordination, evidenced by a 9-second decrease in completion time of the 9-Hole Peg Test with the L hand. Pt showed partial improvement in gross motor coordination, placing 3 additional blocks with the R UE on the Box and Blocks Test, while only placing 1 additional block with the L UE, demonstrating smaller and less coordinated movements as the test progressed. Pt also made slight improvement in functional performance, completing the task of buttoning and unbuttoning 3 buttons in 23.8 seconds compared to 27.8 seconds at the initial evaluation.  In addition, pt was provided with a handout on strategies to improve accuracy and precision when buttoning shirts at home. OT reviewed the handout, demonstrated the techniques, and pt was able to independently return demonstration with minimal cueing  for proper execution. Please see pt's instructions for specific details included in the handout. Buttoning added to pt's exercise chart.   Pt also expressed concerns regarding tremors when initiating functional tasks. Therefore, OT educated pt on the use of weighted adaptive equipment , such as wrist weights, to help with stability and control during ADLs and IADLs. Pt trialed both a 1 lb and 2 lb wrist weight, verbalizing more noticeable improvement with the 2 lb weight. Pt was then encouraged to simulate eating using his R hand to pick up a spoon and bring it to his mouth while the 2 lb weight was applied, pt verbalized notable improvement. OT and pt then browsed resources for purchasing wrist weights if desired. Pt verbalized understanding and willingness to seek resources to manage tremors for improved functional use of his R UE during ADLs and IADLs.  - Therapeutic exercises completed for duration as noted below including:  Initiated Putty Exercises with green putty to begin strengthening, coordination and sensory stimulation of B UEs.  Patient provided visual demonstration, verbal and tactile cues as needed to improve performance of the various exercises/activities including:   - Putty Squeezes - cues to squeeze putty into log for use with other exercises and to fold putty in half with 1 hand  - Putty Rolls - encourage to roll putty into logs with sensory stimulation to entire length of hand, fingers and wrist as needed   - Pinch and Pull with Putty - this motion is combined with different pinches (3-Point Pinch, Tip Pinch, Key Pinch) - patient encouraged to combine tripod, pincer and/or key pinch with pinch and pull motion of putty pulling away from midline, changing between different pinches and changing different directions to change grip  OT was unable to review the remaining exercises below due to limited time. However, OT will review the remaining exercises at the next session. In the  meantime, pt was informed that, if interested, he could access all listed exercises via the browser for independent practice at home.  - Finger Extension with Putty - pt shown how to work on task with all fingers and thumb as well as individual fingers in opposition to thumb  - Finger Adduction with Putty - pt shown how to work on weaving putty between fingers/thumb and then squeeze fingers together while laying hand flat on table top.  - Removing Objects from  Putty  - encouraged to hide items (coins, marble, dice etc) and use one hand at a time to find the objects and identify them by tactile input before s/he digs them out and can see them visually.  OT educated patient on theraputty recommendations: avoid small children, pets, hot environments, place in designated container and avoid contact with fabrics. Patient verbalized understanding.    Patient benefited from extra time, verbal/tactile cues, and modeling of task to allow time for processing of verbal instructions and improve motor planning of unfamiliar movements.  Putty added to pt's exercise chart.   PATIENT EDUCATION: Education details: wrist weights, buttoning techniques, putty exercises Person educated: Patient and Spouse Education method: Explanation, Demonstration, Verbal cues, and Handouts Education comprehension: verbalized understanding, returned demonstration, verbal cues required, and needs further education  HEP's 10/06/23: PWR! Sitting 10/12/23: PWR! Hands and coordination HEP  10/23/23: Handwriting strategies 11/12/23: ADL strategies 11/17/23: PWR! Up, Twist, Step in Supine, sh flex (supine), PD ex chart 11/20/23: Buttoning handout, green putty exercises- Access code ([email or web address] )  GOALS: Goals reviewed with patient? Yes  SHORT TERM GOALS: Target date: 10/29/23 GOALS:  SHORT TERM GOALS: Target date: 10/29/23    Pt will be independent with PD specific HEP.  Baseline: not yet initiated Goal status: MET  2.  Pt  will verbalize understanding of adapted strategies to maximize safety and independence with ADLs/IADLs.  Baseline: not yet initiated Goal status: IN PROGRESS   3.  Pt will write a sentence with no significant decrease in size and maintain 95% legibility and spacing.   Baseline: 95-100% legibility Goal status: MET  4.  Pt will demonstrate improved fine motor coordination for ADLs as evidenced by decreasing 9 hole peg test score for L hand by at least 5 seconds.  Baseline: 33.96 seconds 11/20/23- Right: 25.4, 28.6 sec; Left: 24.9sec Goal status:MET- 9 second decrease of L hand  5.  Pt will demonstrate improved ease with fastening buttons as evidenced by decreasing 3 button/unbutton time by at least 25 seconds.  Baseline: 27.85 seconds 11/20/23- 3 button/unbutton: 23.8 seconds  Goal status: IN PROGRESS  6.  Pt will be able to place at least 3 or more blocks using bilateral hand with completion of Box and Blocks test. Baseline: 49 R hand, 50 L hand blocks 11/20/2023: Right 52 blocks, Left 51 blocks (notable smaller movements as test went on) Goal status: PARTIALLY MET (RUE)   LONG TERM GOALS: Target date: 11/29/23   Pt will verbalize understanding of ways to prevent future PD related complications and PD community resources.  Baseline: not yet initiated Goal status: IN PROGRESS  2.  Pt will write a short paragraph with no significant decrease in size and maintain 100% legibility.  Baseline: 95-100% legibility with spacing concerns Goal status: IN PROGRESS  3.  Pt will verbalize understanding of ways to keep thinking skills sharp and ways to compensate for STM changes in the future.  Baseline: not yet initiated Goal status: IN PROGRESS  4.  Pt will demonstrate increased ease with dressing as evidenced by decreasing PPT#4 (don/ doff jacket) to 16 secs or less.  Baseline: 20.85 seconds Goal status: IN PROGRESS  5. Pt will improve RUE shoulder and elbow flexion strength to 5/5  for improved ability to complete functional transfers and complete dressing tasks.   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 He will continue to benefit from skilled outpatient OT to further improve functional independence in ADLs and IADLs.  PERFORMANCE DEFICITS: in functional skills including ADLs, coordination, tone, strength, Fine motor control, and tremors.   IMPAIRMENTS: are limiting patient from ADLs and IADLs.   CO-MORBIDITIES: may have co-morbidities  that affects occupational performance. Patient will benefit from skilled OT to address above impairments and improve overall function.  REHAB POTENTIAL: Good   PLAN:  OT FREQUENCY: 2x/week  OT DURATION: 8 weeks +eval  PLANNED INTERVENTIONS: 97168 OT Re-evaluation, 97535 self care/ADL training, 02889 therapeutic exercise, 97530 therapeutic activity, 97112 neuromuscular re-education, 97140 manual therapy, 97039 fluidotherapy, 97010 moist heat, 97760 Orthotic Initial, 97763 Orthotic/Prosthetic subsequent, functional mobility training, visual/perceptual remediation/compensation, energy conservation, coping strategies training, patient/family education, and DME and/or AE instructions  RECOMMENDED OTHER SERVICES: none  CONSULTED AND AGREED WITH PLAN OF CARE: Patient and family member/caregiver  PLAN FOR NEXT SESSION:  Monitor BP, further review ADL strategies  (already issued handout)  issue ways to prevent future PD related complications, assess LTG's  (**Will need resubmission to [facility] for approval to go beyond 11/24/23)   Johanne Mcglade, Student-OT 11/20/2023, 12:12  PM

## 2023-11-24 ENCOUNTER — Ambulatory Visit: Payer: Self-pay | Admitting: Physical Therapy

## 2023-11-24 ENCOUNTER — Ambulatory Visit: Payer: Self-pay | Admitting: Occupational Therapy

## 2023-11-24 ENCOUNTER — Encounter: Admitting: Occupational Therapy

## 2023-11-24 VITALS — BP 159/71 | HR 73

## 2023-11-24 DIAGNOSIS — R278 Other lack of coordination: Secondary | ICD-10-CM

## 2023-11-24 DIAGNOSIS — R293 Abnormal posture: Secondary | ICD-10-CM | POA: Diagnosis not present

## 2023-11-24 DIAGNOSIS — M6281 Muscle weakness (generalized): Secondary | ICD-10-CM

## 2023-11-24 DIAGNOSIS — R2681 Unsteadiness on feet: Secondary | ICD-10-CM

## 2023-11-24 DIAGNOSIS — R29898 Other symptoms and signs involving the musculoskeletal system: Secondary | ICD-10-CM

## 2023-11-24 DIAGNOSIS — R29818 Other symptoms and signs involving the nervous system: Secondary | ICD-10-CM

## 2023-11-24 NOTE — Patient Instructions (Addendum)
 Ways to prevent future Parkinson's related complications:  1.   Exercise regularly/daily. Challenge yourself SAFELY and try to get different types of exercise including: PWR! Moves, Cardio (cycling), strength and balance training, and stretches (Yoga)   2.   Focus on BIGGER movements during daily activities- really reach overhead, straighten elbows and extend fingers  3.   When dressing (especially jacket/coat) or reaching for your seatbelt make sure to use your body to assist by twisting while you reach and looking at where you are reaching - this can help to minimize stress on the shoulder and reduce the risk of a rotator cuff tear  4.   Swing your arms when you walk (unless using walker)! People with PD are at increased risk for frozen shoulder and swinging your arms can reduce this risk.  5. Drink plenty of water and eat a high fiber diet (fresh fruits and veggies, nuts/seeds, beans, some fish). Avoid canned foods, reduce sugar intake and dairy when possible  6. Do NOT take your Parkinson's medication around meals (take at least 30 min before a meal, or 1 hour after a meal), especially protein as it blocks the absorption of the medicine and will not work effectively  7. Keep your feet apart when you are standing (wider stance) to allow you to have better balance and to reach further with your arms. Also make sure your feet are apart before standing up.   8. Stay social! - Go out with friends, play card games with others, join a community group or community PD exercise class  9. Get a good night's sleep! Staying active during the day and developing a good bedtime routine can help with this.   10. Communicate with your neurologist any concerns and maintain overall health - keep blood pressure regulated, reduce stress/anxiety, good nutrition/gut health, etc.    Keeping Thinking Skills Sharp:  1. Jigsaw puzzles  2. Card/board games  3. Talking on the phone/going to social events  4.  [email or web address]  5. Online games  6. Word searches/crossword puzzles  7.  Logic puzzles  8. Aerobic exercise (stationary bike)  9. Eating balanced diet (fruits & veggies)  10. Drink water  11. Try something new--new recipe, hobby  12. Crafts  13. Do a variety of activities that are challenging  14.  Plan weekly meals and write a grocery list  15. Add cognitive activities to walking/exercising (think of animal/food/city with each letter of the alphabet, counting backwards, thinking of as many vegetables as you can, etc.).--Only do this if safe (no freezing/falls).

## 2023-11-24 NOTE — Therapy (Signed)
 OUTPATIENT OCCUPATIONAL THERAPY NEURO TREATMENT  Patient Name: Carl Nichols MRN: [id number] DOB:06/07/1940, 83 y.o., male Today's Date: 11/24/2023  PCP: [name] Santos, MD REFERRING PROVIDER: [name] Santos, MD  END OF SESSION:  OT End of Session - 11/24/23 0931     Visit Number 9    Number of Visits 17    Date for Recertification  11/29/23    Authorization Type [employer] MCR approved 8 O.T. visits 9/16 - 11/24/23    Authorization Time Period visit limit [place]    Progress Note Due on Visit 10    OT Start Time 0930    OT Stop Time 1015    OT Time Calculation (min) 45 min    Equipment Utilized During Treatment Green putty, wrist weights    Activity Tolerance Patient tolerated treatment well    Behavior During Therapy WFL for tasks assessed/performed         Past Medical History:  Diagnosis Date   BPH (benign prostatic hyperplasia)    Chronic kidney disease    Dizziness    Hyperlipidemia    Pneumonia    march 2018   Stage III chronic kidney disease (HCC)    Past Surgical History:  Procedure Laterality Date   CATARACT EXTRACTION Right    jan 2013   CATARACT EXTRACTION Left    feb 2013   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   ONSET DATE: 09/07/2023  REFERRING DIAG: R29.898 (ICD-10-CM) - Other symptoms and signs involving the musculoskeletal system  THERAPY DIAG:  Muscle weakness (generalized)  Unsteadiness on feet  Other symptoms and signs involving the nervous system  Other lack of coordination  Other symptoms and signs involving the musculoskeletal system  Rationale for Evaluation and Treatment: Rehabilitation  SUBJECTIVE:   SUBJECTIVE STATEMENT: The PD exercise chart is really helping  Pt accompanied by: significant other   PERTINENT HISTORY: Pt is an 83 year old man who has had progressive worsening of  gait, orthostatic hypotension, and a right arm tremor. Concerns noted at most recent office visit with [facility] on 07/20/23 for Parkinson's but did not fulfill enough criteria at that time (normal muscle tone, no gait retropulsion, no definite bradykinesia). Noted less physical endurance and worsening RUE tremor when completing tasks such as toileting, holding feeding utensils, and writing. Pt was receiving SLP services at this location from 07/03/23-09/08/23  PRECAUTIONS: Fall  WEIGHT BEARING RESTRICTIONS: No  PAIN:  Are you having pain? No  FALLS: Has patient fallen in last 6 months? No  LIVING ENVIRONMENT: Lives with: lives with their spouse Lives in: [place] home, level entry, 5 STE to bedroom on left side going down and rail, den 8 on left side going down Has following equipment at home: Grab bars in walk in shower, high toilet seat  PLOF: Independentneeds some help with buttons, jackets, slower with self feeding, more difficulty recently with wiping  PATIENT GOALS: I would like to improve my handwriting and strengthen my R arm.   OBJECTIVE:  Note: Objective measures were completed at Evaluation unless otherwise noted.  HAND DOMINANCE: Right  ADLs: Overall ADLs: I/Mod I with ADLs, reported increased time with buttons and putting on jacket Transfers/ambulation related to ADLs: Eating: slower eating Grooming: I UB Dressing: difficulty/slower w/ buttons LB Dressing: I Toileting: slightly slower  with wiping  Bathing: I/Mod I Tub Shower transfers: I Equipment: Grab bars and Walk in shower  IADLs: Shopping: I Light housekeeping: I Meal Prep: spouse does cooking, pt cleans Community mobility: I Medication management: Mod I with pill organizer, no difficulty manipulating pills Financial management: I Handwriting: 95-100% legible in print, Mild micrographia, Moderate micrographia, and some spacing issues noted as the words are fairly close together. Reports  writing print and micrographia getting worse with fatigue.  MOBILITY STATUS: Independent is still driving, some stiffness in neck reported. Slower getting seatbelt on per spouse report.   POSTURE COMMENTS:  rounded shoulders and forward head Sitting balance: WFL  ACTIVITY TOLERANCE: Activity tolerance: Reports some increased fatigue, unsure if d/t old age or Parkinson's.  FUNCTIONAL OUTCOME MEASURES: Simulated eating (PPT#2) in R dominant hand: 11.70 seconds Simulated donning/doffing jacket (PPT #4): 20.85 seconds  3 button/unbutton: 27.28 seconds   UPPER EXTREMITY ROM:  WNL, some cueing required to fully extend elbows when flexing BUEs to 90 degrees.  UPPER EXTREMITY MMT:   LUE WNL  MMT Right eval Left eval  Shoulder flexion 4   Elbow flexion 4   Elbow extension    (Blank rows = not tested)  HAND FUNCTION: Grip strength: Right: 57.3 lbs; Left: 52.6 lbs  COORDINATION: 9 Hole Peg test: Right: 24.80 sec; Left: 33.96 sec 11/20/23- Right: 25.4, 28.6 sec; Left: 24.9sec  Box and Blocks:  Right 49 blocks, Left 50 blocks- noted pt at times did not clear partition. 11/20/2023: Box and Blocks:  Right 52 blocks, Left 51 blocks (notable smaller movements as test went on)  Tremors: Resting and Right Simulated eating (PPT#2) in R dominant hand: 11.70 seconds Simulated donning/doffing jacket (PPT #4): 20.85 seconds  3 button/unbutton: 27.28 seconds  11/20/23: 3 button/unbutton: 23.8 seconds   SENSATION: WFL  EDEMA: swelling in B ankles, PT aware.  MUSCLE TONE: RUE: slight catch, minimal cogwheel ridigity   COGNITION: Overall cognitive status: Within functional limits for tasks assessed  VISION: Subjective report: Slight changes in vision over the years.  Baseline vision: Wears glasses all the time Visual history: cataract surgery  VISION ASSESSMENT: WFL  Patient has difficulty with following activities due to following visual impairments:  PERCEPTION: Not  tested  PRAXIS: Not tested  OBSERVATIONS: Concerns with handwriting noted and some slight weakness noted in RUE. Some stiffness noted as well and mild cogwheel rigidity.                                                                                                                           TREATMENT DATE:   Pt issued ways to prevent future PD related complications handout and ways to keep thinking skills sharp handout and reviewed both. See pt instructions for details.   Further reviewed ADL strategies for cutting food and donning/doffing jacket. Pt shown cape method and practiced x 3 with greater success. Pt will benefit from further practice  Also discussed requesting additional visits as pt was only  approved 8 visits through today. This therapist already emailed front office staff to request more  PATIENT EDUCATION: Education details: wrist weights, buttoning techniques, putty exercises Person educated: Patient and Spouse Education method: Explanation, Demonstration, Verbal cues, and Handouts Education comprehension: verbalized understanding, returned demonstration, verbal cues required, and needs further education  HEP's 10/06/23: PWR! Sitting 10/12/23: PWR! Hands and coordination HEP  10/23/23: Handwriting strategies 11/12/23: ADL strategies 11/17/23: PWR! Up, Twist, Step in Supine, sh flex (supine), PD ex chart 11/20/23: Buttoning handout, green putty exercises- Access code ([email or web address] ) 11/24/23: ways to prevent future PD related complications, ways to keep thinking skills sharp  GOALS: Goals reviewed with patient? Yes  SHORT TERM GOALS: Target date: 10/29/23 GOALS:  SHORT TERM GOALS: Target date: 10/29/23    Pt will be independent with PD specific HEP.  Baseline: not yet initiated Goal status: MET  2.  Pt will verbalize understanding of adapted strategies to maximize safety and independence with ADLs/IADLs.  Baseline: not yet initiated Goal status: MET   3.  Pt will  write a sentence with no significant decrease in size and maintain 95% legibility and spacing.   Baseline: 95-100% legibility Goal status: MET  4.  Pt will demonstrate improved fine motor coordination for ADLs as evidenced by decreasing 9 hole peg test score for L hand by at least 5 seconds.  Baseline: 33.96 seconds 11/20/23- Right: 25.4, 28.6 sec; Left: 24.9sec Goal status:MET- 9 second decrease of L hand  5.  Pt will demonstrate improved ease with fastening buttons as evidenced by decreasing 3 button/unbutton time to 25 seconds or less.  Baseline: 27.85 seconds 11/20/23- 3 button/unbutton: 23.8 seconds  Goal status: MET  6.  Pt will be able to place at least 3 or more blocks using bilateral hand with completion of Box and Blocks test. Baseline: 49 R hand, 50 L hand blocks 11/20/2023: Right 52 blocks, Left 51 blocks (notable smaller movements as test went on) Goal status: PARTIALLY MET (RUE)   LONG TERM GOALS: Target date: 11/29/23   Pt will verbalize understanding of ways to prevent future PD related complications and PD community resources.  Baseline: not yet initiated Goal status: IN PROGRESS (issued first part on 11/24/23)  2.  Pt will write a short paragraph with no significant decrease in size and maintain 100% legibility.  Baseline: 95-100% legibility with spacing concerns Goal status: IN PROGRESS  3.  Pt will verbalize understanding of ways to keep thinking skills sharp and ways to compensate for STM changes in the future.  Baseline: not yet initiated Goal status: IN PROGRESS  4.  Pt will demonstrate increased ease with dressing as evidenced by decreasing PPT#4 (don/ doff jacket) to 16 secs or less.  Baseline: 20.85 seconds Goal status: IN PROGRESS  5. Pt will improve RUE shoulder and elbow flexion strength to 5/5 for improved ability to complete functional transfers and complete dressing tasks.   Baseline: 4/5   Goal status: IN  PROGRESS  ASSESSMENT:  CLINICAL IMPRESSION: Patient has met all STG's and progressing towards LTG's with improvements noted in all functional tasks. Pt also reports PD ex chart is helping with consistent carryover of HEPs  PERFORMANCE DEFICITS: in functional skills including ADLs, coordination, tone, strength, Fine motor control, and tremors.   IMPAIRMENTS: are limiting patient from ADLs and IADLs.   CO-MORBIDITIES: may have co-morbidities  that affects occupational performance. Patient will benefit from skilled OT to address above impairments and improve overall function.  REHAB POTENTIAL: Good   PLAN:  OT FREQUENCY: 2x/week  OT DURATION: 8 weeks +eval  PLANNED INTERVENTIONS: 97168 OT Re-evaluation, 97535 self care/ADL training, 02889 therapeutic exercise, 97530 therapeutic activity, 97112 neuromuscular re-education, 97140 manual therapy, 97039 fluidotherapy, 97010 moist heat, 97760 Orthotic Initial, 97763 Orthotic/Prosthetic subsequent, functional mobility training, visual/perceptual remediation/compensation, energy conservation, coping strategies training, patient/family education, and DME and/or AE instructions  RECOMMENDED OTHER SERVICES: none  CONSULTED AND AGREED WITH PLAN OF CARE: Patient and family member/caregiver  PLAN FOR NEXT SESSION:  Monitor BP prn, 10th progress note and renewal - assess progress towards LTG's  Burnard JINNY Roads, OT 11/24/2023, 9:32 AM

## 2023-11-24 NOTE — Therapy (Addendum)
 OUTPATIENT PHYSICAL THERAPY NEURO TREATMENT   Patient Name: Carl Nichols MRN: [id number] DOB:28-Apr-1940, 83 y.o., male Today's Date: 11/24/2023   PCP: [name] Santos, MD  REFERRING PROVIDER: [name] Santos, MD  END OF SESSION:  PT End of Session - 11/24/23 1017     Visit Number 7    Number of Visits 9    Date for Recertification  12/10/23   per re-cert on 89/71/74   Authorization Type [employer]    PT Start Time 1017    PT Stop Time 1100    PT Time Calculation (min) 43 min    Equipment Utilized During Treatment Gait belt    Activity Tolerance Patient tolerated treatment well    Behavior During Therapy WFL for tasks assessed/performed              Past Medical History:  Diagnosis Date   BPH (benign prostatic hyperplasia)    Chronic kidney disease    Dizziness    Hyperlipidemia    Pneumonia    march 2018   Stage III chronic kidney disease (HCC)    Past Surgical History:  Procedure Laterality Date   CATARACT EXTRACTION Right    jan 2013   CATARACT EXTRACTION Left    feb 2013   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   ONSET DATE: 08/28/2023  REFERRING DIAG: R26.0 (ICD-10-CM) - Staggering gait  THERAPY DIAG:  Muscle weakness (generalized)  Abnormal posture  Unsteadiness on feet  Rationale for Evaluation and Treatment: Rehabilitation  SUBJECTIVE:                                                                                                                                                                                             SUBJECTIVE STATEMENT: Patient ambulates into clinic without AD and wife accompanying him. He feels PT is helping a lot and wants to make sure he is approved for more visit. Patient wants to get a bike for at home and will go to [employer] to check out  their bikes. Pt accompanied by: Wife, [name]   PERTINENT HISTORY: PMH: HLD, BPH  R arm tremors and difficulty with gait  Orthostatic hypotension   He feels symptoms started about 6 months ago and has mildly worsened. His wife notes his gait has slowed over the past 6 months.   PAIN:  Are you having pain? No  PRECAUTIONS: Other:  Orthostatic hypotension    FALLS: Has patient fallen in last 6 months? 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  DIAGNOSTIC FINDINGS: MRI brain 08/13/23: IMPRESSION: Normal  COGNITION: Overall cognitive status: Within functional limits for tasks assessed   SENSATION: Light touch: WFL, but pt reporting inside of his calf felt different than the outside   COORDINATION: Heel to shin: slightly bradykinetic with RLE   OBSERVATION: RUE tremor   EDEMA:  Mild swelling to bilateral ankles   POSTURE: rounded shoulders, forward head, and increased thoracic kyphosis  LOWER EXTREMITY ROM:    Decr knee extension AROM   LOWER EXTREMITY MMT:    MMT Right Eval Left Eval  Hip flexion 4+ 5  Hip extension    Hip abduction 4 4  Hip adduction 5 5  Hip internal rotation    Hip external rotation    Knee flexion 5 5  Knee extension 5 4+  Ankle dorsiflexion    Ankle plantarflexion    Ankle inversion    Ankle eversion    (Blank rows = not tested)  All tested in sitting   BED MOBILITY:  Pt reports slower movements getting in and out of  the bed   TRANSFERS: Sit to stand: Modified independence  Assistive device utilized: None     Stand to sit: Modified independence  Assistive device utilized: None      Pt with decr forward lean   GAIT: Findings: Gait Characteristics: step through pattern, decreased arm swing- Right, decreased arm swing- Left, decreased stride length, decreased trunk rotation, and trunk flexed, Distance walked: Clinic distances , Assistive device utilized:None, Level of assistance: Modified independence, and Comments: incr forward head, decr arm swing RUE>LUE  Pt reports his walking feels different, not as relaxed and comfortable   FUNCTIONAL TESTS:  5 times sit to stand: 11.1 seconds with no UE support  10 meter walk test: 9.5 seconds with no AD = 3.45 ft/sec                                                                                                                                TREATMENT DATE: 11/24/23  Therapeutic Activity:  Vitals:   11/24/23 1027  BP: (!) 159/71  Pulse: 73    BP taken in sitting and WNL for treatment today. Pt wearing compression socks, drinking water, and continues to report no symptoms in standing.  Sit to stand  at mat table: Pt performs sit to stand with purple resistance band and PWR! Up with chest opening and big fingers x 10 reps, challenging, cues needs for anterior chest/weight shifting and hip flexion for improved posterior weight shifting during eccentric sitting  NMR: On air ex, CGA with min assist needed with LOB during foam stepping task: Steps with sign - directions written with different colors, colors called out and directions performed 2 sets x about 2 rounds of color call outs with direction performed 1 set x 2 rounds of number call out with direction performed Sit to stand at mat table: Pt performs sit to stand with purple resistance band and PWR! Up with chest opening and big fingers, challenging, cues needs for anterior chest/weight shifting and hip  flexion for improved posterior weight shifting during eccentric sitting Sit to stand on foam and purple band resistance x 10 reps Sit to stand on foam and purple band resistance with cognitive dual task ball throw and food call out with letter x 10 reps  PATIENT EDUCATION: Education details: Continue HEP, reviewing sit to stand and cueing for at home exercise, stationary bike for at home (discussed any stationary bike or recumbent bike would be beneficial as long as it is comfortable for pt and pt can perform moderate intensity on as pt initially asking what brand is best), provided color and direction tasks for HEP Person educated: Patient and Spouse Education method: Explanation, Demonstration, and Handouts Education comprehension: verbalized understanding and returned demonstration  HOME EXERCISE PROGRAM: Standing PWR Moves  Color and direction chart to be completed at counter and someone accompanying you  GOALS: Goals reviewed with patient? Yes  SHORT TERM GOALS:  ALL STGS = LTGS   LONG TERM GOALS: Target date: 10/27/2023 UPDATED LTG DATE FOR RE-CERT:   Pt will be independent with final HEP for strength, gait, balance, posture in order to build upon functional gains made in therapy. Baseline:  Goal status: INITIAL  2.  Pt will improve miniBEST to at least a 25/28 in order to demo decr fall risk.  Baseline: 23/28 Goal status: INITIAL  3.  Pt will verbalize understanding of transition to appropriate community fitness classes upon d/c from PT to maximize gains made in therapy.  Baseline:  Goal status: INITIAL    ASSESSMENT:  CLINICAL IMPRESSION:  Today's skilled physical therapy session progressed LE strengthening and balance with cognitive dual tasks. The session advanced to cognitive tasks during sit to stand with ball throw word recall, and multidirectional stepping with conditions followed to improve dual tasking and foot clearance on uneven surfaces. Direction changing  tasks continues to be challenging due to decreased foot clearance and challenge is increased with cognitive tasks. Stepping up onto different surfaces and dual tasking should be continued to improve above deficits. Patient continues to benefit from skilled physical therapy in order to decrease above mentioned impairments and decrease risk of falls. Will continue per POC.  OBJECTIVE IMPAIRMENTS: Abnormal gait, decreased activity tolerance, decreased balance, decreased coordination, difficulty walking, decreased ROM, decreased strength, impaired flexibility, and postural dysfunction.   ACTIVITY LIMITATIONS: bending, transfers, bed mobility, and locomotion level  PARTICIPATION LIMITATIONS: community activity  PERSONAL FACTORS: Age, Behavior pattern, Past/current experiences, Time since onset of injury/illness/exacerbation, and 1-2 comorbidities: HLD, BPH are also affecting patient's functional outcome.   REHAB POTENTIAL: Good  CLINICAL DECISION MAKING: Evolving/moderate complexity  EVALUATION COMPLEXITY: Moderate  PLAN:  PT FREQUENCY: 2x/week  PT DURATION: 6 weeks - due to delay in scheduling, anticipate just  8 PT visits for 4 weeks   PLANNED INTERVENTIONS: 97164- PT Re-evaluation, 97110-Therapeutic exercises, 97530- Therapeutic activity, 97112- Neuromuscular re-education, 97535- Self Care, 02859- Manual therapy, [phone]- Gait training, Patient/Family education, Balance training, and Stair training  PLAN FOR NEXT SESSION:  SciFit, posture (would benefit from any prone exercises), balance - SLS, unlevel surfaces  Work on reciprocal arm swing with gait, incr forward lean with sit <> stands  Ideas: Add cognitive tasks like naming fruits with alphabet, stepping from foam in accordance to color said and direction written (use paper), rocker board cone taps.    Emmalene Sherry, Student-PT, DPT 11/24/2023, 11:07 AM

## 2023-11-25 ENCOUNTER — Encounter: Payer: Self-pay | Admitting: Cardiovascular Disease

## 2023-11-25 NOTE — Telephone Encounter (Signed)
 Called patient back from my chart question. Told patient that main effect for the alfuzosin is to relax muscle to improve urine flow associated with BPH. It does in turn reduce blood pressure, but I verbalized the importance of staying hydrated and making sure he has enough salt in his diet to keep blood pressure normalized, also emphasized the importance of taking the midodrine as well since he was having symptomatic orthostatic hypotension which could have been increased along with the alfuzosin. Patient verbalized understanding on why both medications are to be taken. No further questions at this time.

## 2023-11-26 ENCOUNTER — Ambulatory Visit: Payer: Self-pay | Admitting: Physical Therapy

## 2023-11-26 ENCOUNTER — Encounter: Payer: Self-pay | Admitting: Occupational Therapy

## 2023-11-26 ENCOUNTER — Encounter: Payer: Self-pay | Admitting: Physical Therapy

## 2023-11-26 ENCOUNTER — Ambulatory Visit: Admitting: Occupational Therapy

## 2023-11-26 VITALS — BP 141/69 | HR 66

## 2023-11-26 DIAGNOSIS — R29898 Other symptoms and signs involving the musculoskeletal system: Secondary | ICD-10-CM

## 2023-11-26 DIAGNOSIS — M6281 Muscle weakness (generalized): Secondary | ICD-10-CM

## 2023-11-26 DIAGNOSIS — R293 Abnormal posture: Secondary | ICD-10-CM

## 2023-11-26 DIAGNOSIS — R29818 Other symptoms and signs involving the nervous system: Secondary | ICD-10-CM

## 2023-11-26 DIAGNOSIS — R2681 Unsteadiness on feet: Secondary | ICD-10-CM

## 2023-11-26 DIAGNOSIS — R278 Other lack of coordination: Secondary | ICD-10-CM

## 2023-11-26 NOTE — Therapy (Unsigned)
 OUTPATIENT PHYSICAL THERAPY NEURO TREATMENT/RE-CERT   Patient Name: Carl Nichols MRN: [id number] DOB:06/24/1940, 83 y.o., male Today's Date: 11/27/2023   PCP: [name] Santos, MD  REFERRING PROVIDER: [name] Santos, MD  END OF SESSION:  PT End of Session - 11/26/23 1020     Visit Number 8    Number of Visits 10    Date for Recertification  12/26/23   per re-cert on 88/86/74   Authorization Type [employer]ICARE    PT Start Time 1018    PT Stop Time 1059    PT Time Calculation (min) 41 min    Equipment Utilized During Treatment Gait belt    Activity Tolerance Patient tolerated treatment well    Behavior During Therapy WFL for tasks assessed/performed              Past Medical History:  Diagnosis Date   BPH (benign prostatic hyperplasia)    Chronic kidney disease    Dizziness    Hyperlipidemia    Pneumonia    march 2018   Stage III chronic kidney disease (HCC)    Past Surgical History:  Procedure Laterality Date   CATARACT EXTRACTION Right    jan 2013   CATARACT EXTRACTION Left    feb 2013   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   ONSET DATE: 08/28/2023  REFERRING DIAG: R26.0 (ICD-10-CM) - Staggering gait  THERAPY DIAG:  Other symptoms and signs involving the nervous system  Abnormal posture  Unsteadiness on feet  Rationale for Evaluation and Treatment: Rehabilitation  SUBJECTIVE:                                                                                                                                                                                             SUBJECTIVE STATEMENT: Going to a support group in [place] tomorrow for PD. Going to check out the senior center and see what exercise equipment they have there. Went to play it again sports yesterday and tried some recumbent  bikes and wasn't comfortable in any of the them. Tried a seated bike that was given by a friend and that has been going well.    Pt accompanied by: Wife, [name]   PERTINENT HISTORY: PMH: HLD, BPH  R arm tremors and difficulty with gait  Orthostatic hypotension   He feels symptoms started about 6 months ago and has mildly worsened. His wife notes his gait  has slowed over the past 6 months.   PAIN:  Are you having pain? No  PRECAUTIONS: Other: Orthostatic hypotension    FALLS: Has patient fallen in last 6 months? 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  DIAGNOSTIC FINDINGS: MRI brain 08/13/23: IMPRESSION: Normal  COGNITION: Overall cognitive status: Within functional limits for tasks assessed   SENSATION: Light touch: WFL, but pt reporting inside of his calf felt different than the outside   COORDINATION: Heel to shin: slightly bradykinetic with RLE   OBSERVATION: RUE tremor   EDEMA:  Mild swelling to bilateral ankles   POSTURE: rounded shoulders, forward head, and increased thoracic kyphosis  LOWER EXTREMITY ROM:    Decr knee extension AROM   LOWER EXTREMITY MMT:    MMT Right Eval Left Eval  Hip flexion 4+ 5  Hip extension    Hip abduction 4 4  Hip adduction 5 5  Hip internal rotation    Hip external rotation    Knee flexion 5 5  Knee extension 5 4+  Ankle dorsiflexion    Ankle plantarflexion    Ankle inversion    Ankle eversion     (Blank rows = not tested)  All tested in sitting   BED MOBILITY:  Pt reports slower movements getting in and out of the bed   TRANSFERS: Sit to stand: Modified independence  Assistive device utilized: None     Stand to sit: Modified independence  Assistive device utilized: None      Pt with decr forward lean   GAIT: Findings: Gait Characteristics: step through pattern, decreased arm swing- Right, decreased arm swing- Left, decreased stride length, decreased trunk rotation, and trunk flexed, Distance walked: Clinic distances , Assistive device utilized:None, Level of assistance: Modified independence, and Comments: incr forward head, decr arm swing RUE>LUE  Pt reports his walking feels different, not as relaxed and comfortable   FUNCTIONAL TESTS:  5 times sit to stand: 11.1 seconds with no UE support  10 meter walk test: 9.5 seconds with no AD = 3.45 ft/sec                                                                                                                                TREATMENT DATE: 11/26/23  Therapeutic Activity:  Vitals:   11/26/23 1026  BP: (!) 141/69  Pulse: 66   BP taken in sitting and WNL for treatment today.  Pt wearing compression socks, drinking water, and continues to report no symptoms in standing.  Reviewed aerobic exercise for PD - importance of moderate intensity level with recumbent/seated bike for 150 minutes a week (or doing 30 minutes 5 times a week), pt reporting he got a seated exercise bike from a friend that he has been enjoying. Also pt is planning on going to look into the community center and see if he can use the SciFit there Provided PD community resources handout (pt does not live in [place], but would benefit from different online resources      [facility] PT Assessment - 11/26/23 1029       Mini-BESTest   Sit To Stand Normal: Comes to stand without use of hands and stabilizes independently.    Rise to Toes Moderate: Heels up, but  not full range (smaller than when holding hands), OR noticeable instability for 3 s.   unable to get full range   Stand on one leg (left) Moderate: < 20 s   1-2 seconds   Stand on one leg (right) Moderate: < 20 s   4-5 seconds   Stand on one leg - lowest score 1    Compensatory Stepping Correction - Forward Normal: Recovers independently with a single, large step (second realignement is allowed).    Compensatory Stepping Correction - Backward Normal: Recovers independently with a single, large step    Compensatory Stepping Correction - Left Lateral Normal: Recovers independently with 1 step (crossover or lateral OK)    Compensatory Stepping Correction - Right Lateral Normal: Recovers independently with 1 step (crossover or lateral OK)    Stepping Corredtion Lateral - lowest score 2    Stance - Feet together, eyes open, firm surface  Normal: 30s    Stance - Feet together, eyes closed, foam surface  Normal: 30s    Incline - Eyes Closed Normal: Stands independently 30s and aligns with gravity    Change in Gait Speed Normal: Significantly changes walkling speed without imbalance    Walk with head turns - Horizontal Normal: performs head turns with no change in gait speed and good balance    Walk with pivot turns Normal: Turns with feet close FAST (< 3 steps) with good balance.    Step over obstacles Normal: Able to step over box with minimal change of gait speed and with good balance.    Timed UP & GO with Dual Task Normal: No noticeable change in sitting, standing or walking while backward counting when compared to TUG without    Mini-BEST total score 26      Timed Up and Go Test   Normal TUG (seconds) 9.6    Cognitive TUG (seconds) 10.4          NMR: With 4 larger orange obstacles and 2 air ex foam pads placed to work on a compliant surface, performed alternating step through pattern to work on foot clearance/SLS stability, performed down and back x4 reps leading with each leg, CGA as  needed for balance esp when stepping onto air ex, intermittent fingertip support at countertop  Standing PWR Up onto toes 10 reps to work on anterior weight shifting, intermittently losing balance anteriorly, but had chair in front of him as needed. Discussed can work on this at home with his standing PWR moves as long as a chair is there for safety    Access Code: [email or web address] URL: [email or web address] Date: 11/26/2023 Prepared by: Sheffield Senate  Added to HEP to work on [employer]  shifting/marching   Exercises - Standing Marching  - 1 x daily - 5 x weekly - 3 sets  PATIENT EDUCATION: Education details: Continue HEP and added standing marching to work on SLSA, reviewed aerobic exercise at moderate intensity for PD, results of goals, will add 1x week for 2 weeks and then plan to D/C 23/28 Person educated: Patient and Spouse Education method: Explanation, Demonstration, and Handouts Education comprehension: verbalized understanding and returned demonstration  HOME EXERCISE PROGRAM: Standing PWR Moves  Color and direction chart to be completed at counter and someone accompanying you  Access Code: [email or web address] URL: [email or web address] Date: 11/26/2023 Prepared by: Sheffield Senate  Exercises - Standing Marching  - 1 x daily - 5 x weekly - 3 sets  GOALS: Goals reviewed with patient? Yes  SHORT TERM GOALS:  ALL STGS = LTGS   LONG TERM GOALS: Target date: 10/27/2023 UPDATED LTG DATE FOR RE-CERT: 88/85/74  Pt will be independent with final HEP for strength, gait, balance, posture in order to build upon functional gains made in therapy. Baseline: will benefit from updates/additions  Goal status: ON-GOING  2.  Pt will improve miniBEST to at least a 25/28 in order to demo decr fall risk.  Baseline: 23/28  26/28 (11/13) Goal status: MET  3.  Pt will verbalize understanding of transition to appropriate community fitness classes upon d/c from PT to maximize  gains made in therapy.  Baseline: provided community resources, pt looking for appropriate classes near where pt lives near [place] and plans to get his own stationary bike  Goal status: MET  LONG TERM GOALS: Target date: 12/25/2023  Pt will be independent with final HEP for strength, gait, balance, posture in order to build upon functional gains made in therapy. Baseline: will benefit from updates/additions  Goal status: ON-GOING    ASSESSMENT:  CLINICAL IMPRESSION:  Today's skilled session focused on assessing pt's LTGs. Pt has met LTG #3 in regards to community resources and plan for community fitness going forwards. Pt lives in [place], so not near [place] exercise classes, but plans on looking into some in [place] and going to a PD support group there. Pt also has his own stationary bike that he has been using at home. Pt met LTG #2 in regards to miniBEST, improved to a 26/28 (previously was 23/28). Pt has been performing his HEP at home for PD specific exercises. Pt's POC was delayed due to needing to see the cardiologist in regards to symptomatic orthostatic hypotension (doing much better) and getting officially diagnosed with PD. Pt will continue to benefit from a couple more visits of skilled PT to address high level balance, continue to provide PD education, and finalize HEP.   OBJECTIVE IMPAIRMENTS: Abnormal gait, decreased activity tolerance, decreased balance, decreased coordination, difficulty walking, decreased ROM, decreased strength, impaired flexibility, and postural dysfunction.   ACTIVITY LIMITATIONS: bending, transfers, bed mobility, and locomotion level  PARTICIPATION LIMITATIONS: community activity  PERSONAL FACTORS: Age, Behavior pattern, Past/current experiences, Time since onset of injury/illness/exacerbation, and 1-2 comorbidities: HLD, BPH are also affecting patient's functional outcome.   REHAB POTENTIAL: Good  CLINICAL DECISION MAKING: Evolving/moderate  complexity  EVALUATION COMPLEXITY: Moderate  PLAN:  PT FREQUENCY: 2x/week  PT DURATION: 6 weeks - due to delay in scheduling, anticipate just 8 PT visits for 4 weeks  Plus an additional 1x week for 4 weeks for re-cert   PLANNED INTERVENTIONS: 97164- PT Re-evaluation, 97110-Therapeutic exercises, 97530- Therapeutic activity, 97112- Neuromuscular re-education, 97535- Self Care, 02859- Manual therapy, [id number]- Gait training,  Patient/Family education, Balance training, and Stair training  PLAN FOR NEXT SESSION:  SciFit, posture (would benefit from any prone exercises), balance - SLS, unlevel surfaces  D/C in 2 visits with plan for PD screens, review standing PWR moves as needed     Sheffield LOISE Senate, PT, DPT 11/27/2023, 10:41 AM

## 2023-11-26 NOTE — Therapy (Signed)
 OUTPATIENT OCCUPATIONAL THERAPY NEURO TREATMENT/10th Progress Note and Renewal   Patient Name: Carl Nichols MRN: [id number] DOB:23-May-1940, 83 y.o., male Today's Date: 11/26/2023  PCP: [name] Santos, MD REFERRING PROVIDER: [name] Santos, MD  END OF SESSION:  OT End of Session - 11/26/23 0931     Visit Number 10    Number of Visits 17    Date for Recertification  11/29/23    Authorization Type [employer] MCR approved 8 O.T. visits 9/16 - 11/24/23, approved 7 additional visits from 11/26/23 - 01/14/24    Authorization Time Period visit limit [place]    Authorization - Visit Number 1    Authorization - Number of Visits 7    Progress Note Due on Visit 10    OT Start Time 0930    OT Stop Time 1015    OT Time Calculation (min) 45 min    Equipment Utilized During Treatment Green putty, wrist weights    Activity Tolerance Patient tolerated treatment well    Behavior During Therapy WFL for tasks assessed/performed         Past Medical History:  Diagnosis Date   BPH (benign prostatic hyperplasia)    Chronic kidney disease    Dizziness    Hyperlipidemia    Pneumonia    march 2018   Stage III chronic kidney disease (HCC)    Past Surgical History:  Procedure Laterality Date   CATARACT EXTRACTION Right    jan 2013   CATARACT EXTRACTION Left    feb 2013   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   ONSET DATE: 09/07/2023  REFERRING DIAG: R29.898 (ICD-10-CM) - Other symptoms and signs involving the musculoskeletal system  THERAPY DIAG:  Other symptoms and signs involving the nervous system  Other lack of coordination  Other symptoms and signs involving the musculoskeletal system  Abnormal posture  Unsteadiness on feet  Muscle weakness (generalized)  Rationale for Evaluation and Treatment: Rehabilitation  SUBJECTIVE:   SUBJECTIVE  STATEMENT: No pain or falls  Pt accompanied by: significant other   PERTINENT HISTORY: Pt is an 83 year old man who has had progressive worsening of gait, orthostatic hypotension, and a right arm tremor. Concerns noted at most recent office visit with [facility] on 07/20/23 for Parkinson's but did not fulfill enough criteria at that time (normal muscle tone, no gait retropulsion, no definite bradykinesia). Noted less physical endurance and worsening RUE tremor when completing tasks such as toileting, holding feeding utensils, and writing. Pt was receiving SLP services at this location from 07/03/23-09/08/23  PRECAUTIONS: Fall  WEIGHT BEARING RESTRICTIONS: No  PAIN:  Are you having pain? No  FALLS: Has patient fallen in last 6 months? No  LIVING ENVIRONMENT: Lives with: lives with their spouse Lives in: [place] home, level entry, 5 STE to bedroom on left side going down and rail, den 8 on left side going down Has following equipment at home: Grab bars in walk in shower, high toilet seat  PLOF: Independentneeds some help with buttons, jackets, slower with self feeding, more difficulty recently with wiping  PATIENT GOALS: I would like to improve my handwriting and strengthen my R arm.   OBJECTIVE:  Note: Objective measures were completed at Evaluation unless otherwise noted.  HAND DOMINANCE: Right  ADLs: Overall ADLs: I/Mod I with  ADLs, reported increased time with buttons and putting on jacket Transfers/ambulation related to ADLs: Eating: slower eating Grooming: I UB Dressing: difficulty/slower w/ buttons LB Dressing: I Toileting: slightly slower with wiping  Bathing: I/Mod I Tub Shower transfers: I Equipment: Grab bars and Walk in shower  IADLs: Shopping: I Light housekeeping: I Meal Prep: spouse does cooking, pt cleans Community mobility: I Medication management: Mod I with pill organizer, no difficulty manipulating pills Financial management:  I Handwriting: 95-100% legible in print, Mild micrographia, Moderate micrographia, and some spacing issues noted as the words are fairly close together. Reports writing print and micrographia getting worse with fatigue.  MOBILITY STATUS: Independent is still driving, some stiffness in neck reported. Slower getting seatbelt on per spouse report.   POSTURE COMMENTS:  rounded shoulders and forward head Sitting balance: WFL  ACTIVITY TOLERANCE: Activity tolerance: Reports some increased fatigue, unsure if d/t old age or Parkinson's.  FUNCTIONAL OUTCOME MEASURES: Simulated eating (PPT#2) in R dominant hand: 11.70 seconds Simulated donning/doffing jacket (PPT #4): 20.85 seconds  3 button/unbutton: 27.28 seconds   UPPER EXTREMITY ROM:  WNL, some cueing required to fully extend elbows when flexing BUEs to 90 degrees.  UPPER EXTREMITY MMT:   LUE WNL  MMT Right eval Left eval  Shoulder flexion 4   Elbow flexion 4   Elbow extension    (Blank rows = not tested)  HAND FUNCTION: Grip strength: Right: 57.3 lbs; Left: 52.6 lbs  COORDINATION: 9 Hole Peg test: Right: 24.80 sec; Left: 33.96 sec 11/20/23- Right: 25.4, 28.6 sec; Left: 24.9sec  Box and Blocks:  Right 49 blocks, Left 50 blocks- noted pt at times did not clear partition. 11/20/2023: Box and Blocks:  Right 52 blocks, Left 51 blocks (notable smaller movements as test went on)  Tremors: Resting and Right Simulated eating (PPT#2) in R dominant hand: 11.70 seconds Simulated donning/doffing jacket (PPT #4): 20.85 seconds  3 button/unbutton: 27.28 seconds  11/20/23: 3 button/unbutton: 23.8 seconds   SENSATION: WFL  EDEMA: swelling in B ankles, PT aware.  MUSCLE TONE: RUE: slight catch, minimal cogwheel ridigity   COGNITION: Overall cognitive status: Within functional limits for tasks assessed  VISION: Subjective report: Slight changes in vision over the years.  Baseline vision: Wears glasses all the time Visual history:  cataract surgery  VISION ASSESSMENT: WFL  Patient has difficulty with following activities due to following visual impairments:  PERCEPTION: Not tested  PRAXIS: Not tested  OBSERVATIONS: Concerns with handwriting noted and some slight weakness noted in RUE. Some stiffness noted as well and mild cogwheel rigidity.                                                                                                                           TREATMENT DATE:   Discussed and further assessed progress towards LTG's for renewal period - see below.   Pt standing in corner and hitting numbered targets in order, and then out of order for weight shifts, functional reaching,  trunk rotation, and cognitive challenge. Progressed to changing reaching from contralateral to ipsilateral reaching for specific numbers for increased cognitive challenge. Progressed to standing on compliant surface for increased challenge to balance  Pt shown alternative ways to do PWR! Twist against wall in standing using small boomwhackers. Pt demo with cues for large amplitude movements. Pt also instructed that he can try seated against wall as well.   Pt tossing scarf in air and catching with opposite hand w/ cues to toss up and big and catch big in middle of scarf. Pt with min difficulty - too add ambulation and/or cognitive task next session  PATIENT EDUCATION: Education details: see above Person educated: Patient and Spouse Education method: Explanation, Demonstration, and Verbal cues Education comprehension: verbalized understanding, returned demonstration, and verbal cues required  HEP's 10/06/23: PWR! Sitting 10/12/23: PWR! Hands and coordination HEP  10/23/23: Handwriting strategies 11/12/23: ADL strategies 11/17/23: PWR! Up, Twist, Step in Supine, sh flex (supine), PD ex chart 11/20/23: Buttoning handout, green putty exercises- Access code ([email or web address] ) 11/24/23: ways to prevent future PD related complications, ways to  keep thinking skills sharp  GOALS: Goals reviewed with patient? Yes  SHORT TERM GOALS: Target date: 10/29/23 GOALS:  SHORT TERM GOALS: Target date: 10/29/23    Pt will be independent with PD specific HEP.  Baseline: not yet initiated Goal status: MET  2.  Pt will verbalize understanding of adapted strategies to maximize safety and independence with ADLs/IADLs.  Baseline: not yet initiated Goal status: MET   3.  Pt will write a sentence with no significant decrease in size and maintain 95% legibility and spacing.   Baseline: 95-100% legibility Goal status: MET  4.  Pt will demonstrate improved fine motor coordination for ADLs as evidenced by decreasing 9 hole peg test score for L hand by at least 5 seconds.  Baseline: 33.96 seconds 11/20/23- Right: 25.4, 28.6 sec; Left: 24.9sec Goal status:MET- 9 second decrease of L hand  5.  Pt will demonstrate improved ease with fastening buttons as evidenced by decreasing 3 button/unbutton time to 25 seconds or less.  Baseline: 27.85 seconds 11/20/23- 3 button/unbutton: 23.8 seconds  Goal status: MET  6.  Pt will be able to place at least 3 or more blocks using bilateral hand with completion of Box and Blocks test. Baseline: 49 R hand, 50 L hand blocks 11/20/2023: Right 52 blocks, Left 51 blocks (notable smaller movements as test went on) Goal status: PARTIALLY MET (RUE)   LONG TERM GOALS: Extended through 01/13/24 for unmet/revised goals   Pt will verbalize understanding of ways to prevent future PD related complications and PD community resources.  Baseline: not yet initiated Goal status: IN PROGRESS (issued first part on 11/24/23)  2.  Pt will write a short paragraph with no significant decrease in size and maintain 100% legibility.  Baseline: 95-100% legibility with spacing concerns Goal status: IN PROGRESS (11/26/23 - 95% legibility maintaining size except 1 word)   3.  Pt will verbalize understanding of ways to keep  thinking skills sharp and ways to compensate for STM changes in the future.  Baseline: not yet initiated Goal status: MET   4.  Pt will demonstrate increased ease with dressing as evidenced by decreasing PPT#4 (don/ doff jacket) to 16 secs or less.  Baseline: 20.85 seconds Goal status: IN PROGRESS  5. Pt will improve RUE elbow extension without cues for reaching tasks   Baseline: requires cues  Goal status: REVISED   ASSESSMENT:  CLINICAL IMPRESSION: This  10th progress note is for dates: 09/29/23 - 11/26/23 and for this renewal period: pt has met all STG's and 1 LTG. Pt progressing towards remaining LTG's. Pt will continue to benefit from skilled O.T. to address remaining deficits and unmet/revised LTG's. Pt doing well overall with HEP's and functional difference noted per pt. Pt still requires min cues for large amplitude movements but responds well after initial cueing.   PERFORMANCE DEFICITS: in functional skills including ADLs, coordination, tone, strength, Fine motor control, and tremors.   IMPAIRMENTS: are limiting patient from ADLs and IADLs.   CO-MORBIDITIES: may have co-morbidities  that affects occupational performance. Patient will benefit from skilled OT to address above impairments and improve overall function.  REHAB POTENTIAL: Good   PLAN:  OT FREQUENCY: 1x/week  OT DURATION: 6 weeks (for renewal period)   PLANNED INTERVENTIONS: 97168 OT Re-evaluation, 97535 self care/ADL training, 02889 therapeutic exercise, 97530 therapeutic activity, 97112 neuromuscular re-education, 97140 manual therapy, 97039 fluidotherapy, 97010 moist heat, 97760 Orthotic Initial, 97763 Orthotic/Prosthetic subsequent, functional mobility training, visual/perceptual remediation/compensation, energy conservation, coping strategies training, patient/family education, and DME and/or AE instructions  RECOMMENDED OTHER SERVICES: none  CONSULTED AND AGREED WITH PLAN OF CARE: Patient and family  member/caregiver  PLAN FOR NEXT SESSION:  Scarf activity w/ cognitive and/or physical challenge PWR! Step movement patterns with cognitive inhibition UBE Community resources  [place], OT 11/26/2023, 9:35 AM

## 2023-12-02 ENCOUNTER — Encounter: Payer: Self-pay | Admitting: Occupational Therapy

## 2023-12-02 ENCOUNTER — Ambulatory Visit: Admitting: Occupational Therapy

## 2023-12-02 DIAGNOSIS — R29818 Other symptoms and signs involving the nervous system: Secondary | ICD-10-CM

## 2023-12-02 DIAGNOSIS — R293 Abnormal posture: Secondary | ICD-10-CM

## 2023-12-02 DIAGNOSIS — R278 Other lack of coordination: Secondary | ICD-10-CM

## 2023-12-02 DIAGNOSIS — R2681 Unsteadiness on feet: Secondary | ICD-10-CM

## 2023-12-02 DIAGNOSIS — R29898 Other symptoms and signs involving the musculoskeletal system: Secondary | ICD-10-CM

## 2023-12-02 NOTE — Therapy (Signed)
 OUTPATIENT OCCUPATIONAL THERAPY NEURO TREATMENT/10th Progress Note and Renewal   Patient Name: Carl Nichols MRN: [id number] DOB:1940/10/29, 83 y.o., male Today's Date: 12/02/2023  PCP: [name] Santos, MD REFERRING PROVIDER: [name] Santos, MD  END OF SESSION:  OT End of Session - 12/02/23 1406     Visit Number 11    Number of Visits 17    Date for Recertification  11/29/23    Authorization Type [employer] MCR approved 8 O.T. visits 9/16 - 11/24/23, approved 7 additional visits from 11/26/23 - 01/14/24    Authorization Time Period visit limit [place]    Authorization - Visit Number 2    Authorization - Number of Visits 7    Progress Note Due on Visit 10    OT Start Time 1403    OT Stop Time 1445    OT Time Calculation (min) 42 min    Equipment Utilized During Treatment Green putty, wrist weights    Activity Tolerance Patient tolerated treatment well    Behavior During Therapy WFL for tasks assessed/performed         Past Medical History:  Diagnosis Date   BPH (benign prostatic hyperplasia)    Chronic kidney disease    Dizziness    Hyperlipidemia    Pneumonia    march 2018   Stage III chronic kidney disease (HCC)    Past Surgical History:  Procedure Laterality Date   CATARACT EXTRACTION Right    jan 2013   CATARACT EXTRACTION Left    feb 2013   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   ONSET DATE: 09/07/2023  REFERRING DIAG: R29.898 (ICD-10-CM) - Other symptoms and signs involving the musculoskeletal system  THERAPY DIAG:  Other symptoms and signs involving the nervous system  Abnormal posture  Unsteadiness on feet  Other lack of coordination  Other symptoms and signs involving the musculoskeletal system  Rationale for Evaluation and Treatment: Rehabilitation  SUBJECTIVE:   SUBJECTIVE STATEMENT: No pain or falls. I'm  up to the full dosage of Sinemet  Pt accompanied by: significant other   PERTINENT HISTORY: Pt is an 83 year old man who has had progressive worsening of gait, orthostatic hypotension, and a right arm tremor. Concerns noted at most recent office visit with [facility] on 07/20/23 for Parkinson's but did not fulfill enough criteria at that time (normal muscle tone, no gait retropulsion, no definite bradykinesia). Noted less physical endurance and worsening RUE tremor when completing tasks such as toileting, holding feeding utensils, and writing. Pt was receiving SLP services at this location from 07/03/23-09/08/23  PRECAUTIONS: Fall  WEIGHT BEARING RESTRICTIONS: No  PAIN:  Are you having pain? No  FALLS: Has patient fallen in last 6 months? No  LIVING ENVIRONMENT: Lives with: lives with their spouse Lives in: [place] home, level entry, 5 STE to bedroom on left side going down and rail, den 8 on left side going down Has following equipment at home: Grab bars in walk in shower, high toilet seat  PLOF: Independentneeds some help with buttons, jackets, slower with self feeding, more difficulty recently with wiping  PATIENT GOALS: I would like to improve my handwriting and strengthen my R arm.   OBJECTIVE:  Note: Objective measures were completed at Evaluation unless otherwise noted.  HAND DOMINANCE: Right  ADLs: Overall  ADLs: I/Mod I with ADLs, reported increased time with buttons and putting on jacket Transfers/ambulation related to ADLs: Eating: slower eating Grooming: I UB Dressing: difficulty/slower w/ buttons LB Dressing: I Toileting: slightly slower with wiping  Bathing: I/Mod I Tub Shower transfers: I Equipment: Grab bars and Walk in shower  IADLs: Shopping: I Light housekeeping: I Meal Prep: spouse does cooking, pt cleans Community mobility: I Medication management: Mod I with pill organizer, no difficulty manipulating pills Financial management:  I Handwriting: 95-100% legible in print, Mild micrographia, Moderate micrographia, and some spacing issues noted as the words are fairly close together. Reports writing print and micrographia getting worse with fatigue.  MOBILITY STATUS: Independent is still driving, some stiffness in neck reported. Slower getting seatbelt on per spouse report.   POSTURE COMMENTS:  rounded shoulders and forward head Sitting balance: WFL  ACTIVITY TOLERANCE: Activity tolerance: Reports some increased fatigue, unsure if d/t old age or Parkinson's.  FUNCTIONAL OUTCOME MEASURES: Simulated eating (PPT#2) in R dominant hand: 11.70 seconds Simulated donning/doffing jacket (PPT #4): 20.85 seconds  3 button/unbutton: 27.28 seconds   UPPER EXTREMITY ROM:  WNL, some cueing required to fully extend elbows when flexing BUEs to 90 degrees.  UPPER EXTREMITY MMT:   LUE WNL  MMT Right eval Left eval  Shoulder flexion 4   Elbow flexion 4   Elbow extension    (Blank rows = not tested)  HAND FUNCTION: Grip strength: Right: 57.3 lbs; Left: 52.6 lbs  COORDINATION: 9 Hole Peg test: Right: 24.80 sec; Left: 33.96 sec 11/20/23- Right: 25.4, 28.6 sec; Left: 24.9sec  Box and Blocks:  Right 49 blocks, Left 50 blocks- noted pt at times did not clear partition. 11/20/2023: Box and Blocks:  Right 52 blocks, Left 51 blocks (notable smaller movements as test went on)  Tremors: Resting and Right Simulated eating (PPT#2) in R dominant hand: 11.70 seconds Simulated donning/doffing jacket (PPT #4): 20.85 seconds  3 button/unbutton: 27.28 seconds  11/20/23: 3 button/unbutton: 23.8 seconds   SENSATION: WFL  EDEMA: swelling in B ankles, PT aware.  MUSCLE TONE: RUE: slight catch, minimal cogwheel ridigity   COGNITION: Overall cognitive status: Within functional limits for tasks assessed  VISION: Subjective report: Slight changes in vision over the years.  Baseline vision: Wears glasses all the time Visual history:  cataract surgery  VISION ASSESSMENT: WFL  Patient has difficulty with following activities due to following visual impairments:  PERCEPTION: Not tested  PRAXIS: Not tested  OBSERVATIONS: Concerns with handwriting noted and some slight weakness noted in RUE. Some stiffness noted as well and mild cogwheel rigidity.                                                                                                                           TREATMENT DATE:   Pt tossing scarf in air and catching with opposite hand w/ cues to toss up and big and catch big in middle of scarf. Added ambulation to task with mod difficulty  and occasional scissoring of LE'S. Then progressed to tossing scarf with cognitive activities (no ambulation) w/ mod difficulty and difficulty dual tasking  PWR! Step patterns forwards, side, and backwards with min difficulty. Adding UE involvement with mod difficulty. Adding cognitive reciprocal inhibition with max difficulty, LOB, and inability to dual task. Modified for simpler cognitive task to decrease motor/physical deficits  UBE x 5 mintues, level 3 for UE strength/endurance keeping RPM's in 40s to increase HR  Pt given social worker's email to get on PD email list and any community events/activities and exercises classes offered.  Reviewed medication schedule and adjusting meal time prn   PATIENT EDUCATION: Education details: see above Person educated: Patient and Spouse Education method: Explanation, Demonstration, and Verbal cues Education comprehension: verbalized understanding, returned demonstration, and verbal cues required  HEP's 10/06/23: PWR! Sitting 10/12/23: PWR! Hands and coordination HEP  10/23/23: Handwriting strategies 11/12/23: ADL strategies 11/17/23: PWR! Up, Twist, Step in Supine, sh flex (supine), PD ex chart 11/20/23: Buttoning handout, green putty exercises- Access code ([email or web address] ) 11/24/23: ways to prevent future PD related complications, ways to  keep thinking skills sharp  GOALS: Goals reviewed with patient? Yes  SHORT TERM GOALS: Target date: 10/29/23 GOALS:  SHORT TERM GOALS: Target date: 10/29/23    Pt will be independent with PD specific HEP.  Baseline: not yet initiated Goal status: MET  2.  Pt will verbalize understanding of adapted strategies to maximize safety and independence with ADLs/IADLs.  Baseline: not yet initiated Goal status: MET   3.  Pt will write a sentence with no significant decrease in size and maintain 95% legibility and spacing.   Baseline: 95-100% legibility Goal status: MET  4.  Pt will demonstrate improved fine motor coordination for ADLs as evidenced by decreasing 9 hole peg test score for L hand by at least 5 seconds.  Baseline: 33.96 seconds 11/20/23- Right: 25.4, 28.6 sec; Left: 24.9sec Goal status:MET- 9 second decrease of L hand  5.  Pt will demonstrate improved ease with fastening buttons as evidenced by decreasing 3 button/unbutton time to 25 seconds or less.  Baseline: 27.85 seconds 11/20/23- 3 button/unbutton: 23.8 seconds  Goal status: MET  6.  Pt will be able to place at least 3 or more blocks using bilateral hand with completion of Box and Blocks test. Baseline: 49 R hand, 50 L hand blocks 11/20/2023: Right 52 blocks, Left 51 blocks (notable smaller movements as test went on) Goal status: PARTIALLY MET (RUE)   LONG TERM GOALS: Extended through 01/13/24 for unmet/revised goals   Pt will verbalize understanding of ways to prevent future PD related complications and PD community resources.  Baseline: not yet initiated Goal status: MET  2.  Pt will write a short paragraph with no significant decrease in size and maintain 100% legibility.  Baseline: 95-100% legibility with spacing concerns Goal status: IN PROGRESS (11/26/23 - 95% legibility maintaining size except 1 word)   3.  Pt will verbalize understanding of ways to keep thinking skills sharp and ways to compensate  for STM changes in the future.  Baseline: not yet initiated Goal status: MET   4.  Pt will demonstrate increased ease with dressing as evidenced by decreasing PPT#4 (don/ doff jacket) to 16 secs or less.  Baseline: 20.85 seconds Goal status: IN PROGRESS  5. Pt will improve RUE elbow extension without cues for reaching tasks   Baseline: requires cues  Goal status: REVISED   ASSESSMENT:  CLINICAL IMPRESSION: Pt has met all STG's and  2 LTGs. Pt progressing towards remaining LTG's. Pt will continue to benefit from skilled O.T. to address remaining deficits and unmet/revised LTG's. Pt doing well overall with HEP's and functional difference noted per pt. Pt still requires min cues for large amplitude movements but responds well after initial cueing.   PERFORMANCE DEFICITS: in functional skills including ADLs, coordination, tone, strength, Fine motor control, and tremors.   IMPAIRMENTS: are limiting patient from ADLs and IADLs.   CO-MORBIDITIES: may have co-morbidities  that affects occupational performance. Patient will benefit from skilled OT to address above impairments and improve overall function.  REHAB POTENTIAL: Good   PLAN:  OT FREQUENCY: 1x/week  OT DURATION: 6 weeks (for renewal period)   PLANNED INTERVENTIONS: 97168 OT Re-evaluation, 97535 self care/ADL training, 02889 therapeutic exercise, 97530 therapeutic activity, 97112 neuromuscular re-education, 97140 manual therapy, 97039 fluidotherapy, 97010 moist heat, 97760 Orthotic Initial, 97763 Orthotic/Prosthetic subsequent, functional mobility training, visual/perceptual remediation/compensation, energy conservation, coping strategies training, patient/family education, and DME and/or AE instructions  RECOMMENDED OTHER SERVICES: none  CONSULTED AND AGREED WITH PLAN OF CARE: Patient and family member/caregiver  PLAN FOR NEXT SESSION:  Coordination in standing Scarf activity w/ cognitive and/or physical challenge PWR!  Step movement patterns with cognitive inhibition UBE  Burnard JINNY Roads, OT 12/02/2023, 2:07 PM

## 2023-12-08 ENCOUNTER — Encounter: Payer: Self-pay | Admitting: Neurology

## 2023-12-15 ENCOUNTER — Encounter: Payer: Self-pay | Admitting: Neurology

## 2023-12-17 ENCOUNTER — Encounter: Payer: Self-pay | Admitting: Occupational Therapy

## 2023-12-17 ENCOUNTER — Ambulatory Visit: Admitting: Occupational Therapy

## 2023-12-17 ENCOUNTER — Encounter: Payer: Self-pay | Admitting: Physical Therapy

## 2023-12-17 ENCOUNTER — Ambulatory Visit: Admitting: Physical Therapy

## 2023-12-17 VITALS — BP 160/68 | HR 68

## 2023-12-17 DIAGNOSIS — R2689 Other abnormalities of gait and mobility: Secondary | ICD-10-CM | POA: Diagnosis present

## 2023-12-17 DIAGNOSIS — M6281 Muscle weakness (generalized): Secondary | ICD-10-CM | POA: Diagnosis present

## 2023-12-17 DIAGNOSIS — R278 Other lack of coordination: Secondary | ICD-10-CM | POA: Diagnosis present

## 2023-12-17 DIAGNOSIS — R29898 Other symptoms and signs involving the musculoskeletal system: Secondary | ICD-10-CM | POA: Diagnosis present

## 2023-12-17 DIAGNOSIS — R2681 Unsteadiness on feet: Secondary | ICD-10-CM

## 2023-12-17 DIAGNOSIS — R29818 Other symptoms and signs involving the nervous system: Secondary | ICD-10-CM | POA: Diagnosis present

## 2023-12-17 DIAGNOSIS — R293 Abnormal posture: Secondary | ICD-10-CM | POA: Diagnosis present

## 2023-12-17 NOTE — Therapy (Signed)
 OUTPATIENT PHYSICAL THERAPY NEURO TREATMENT   Patient Name: Carl Nichols MRN: [id number] DOB:06-Dec-1940, 83 y.o., male Today's Date: 12/17/2023   PCP: [name] Santos, MD  REFERRING PROVIDER: [name] Santos, MD  END OF SESSION:  PT End of Session - 12/17/23 1015     Visit Number 9    Number of Visits 10    Date for Recertification  12/26/23   per re-cert on 88/86/74   Authorization Type [employer]IC[employer]    PT Start Time 1014    PT Stop Time 1055    PT Time Calculation (min) 41 min    Equipment Utilized During Treatment Gait belt    Activity Tolerance Patient tolerated treatment well    Behavior During Therapy WFL for tasks assessed/performed              Past Medical History:  Diagnosis Date   BPH (benign prostatic hyperplasia)    Chronic kidney disease    Dizziness    Hyperlipidemia    Pneumonia    march 2018   Stage III chronic kidney disease (HCC)    Past Surgical History:  Procedure Laterality Date   CATARACT EXTRACTION Right    jan 2013   CATARACT EXTRACTION Left    feb 2013   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   ONSET DATE: 08/28/2023  REFERRING DIAG: R26.0 (ICD-10-CM) - Staggering gait  THERAPY DIAG:  Other symptoms and signs involving the nervous system  Abnormal posture  Unsteadiness on feet  Rationale for Evaluation and Treatment: Rehabilitation  SUBJECTIVE:                                                                                                                                                                                             SUBJECTIVE STATEMENT: Has not had any dizziness episodes. Wanting to start doing rock steady boxing. Bought a recumbent bike from one of their friends and has been trying to ride it for 30 minutes daily.    Pt accompanied by: Wife,  [name]   PERTINENT HISTORY: PMH: HLD, BPH  R arm tremors and difficulty with gait  Orthostatic hypotension   He feels symptoms started about 6 months ago and has mildly worsened. His wife notes his gait has slowed over the past 6 months.   PAIN:  Are you having pain? No  PRECAUTIONS: Other: Orthostatic hypotension    FALLS: Has patient fallen  in last 6 months? 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  DIAGNOSTIC FINDINGS: MRI brain 08/13/23: IMPRESSION: Normal  COGNITION: Overall cognitive status: Within functional limits for tasks assessed   SENSATION: Light touch: WFL, but pt reporting inside of his calf felt different than the outside   COORDINATION: Heel to shin: slightly bradykinetic with RLE   OBSERVATION: RUE tremor   EDEMA:  Mild swelling to bilateral ankles   POSTURE: rounded shoulders, forward head, and increased thoracic kyphosis  LOWER EXTREMITY ROM:    Decr knee extension AROM   LOWER EXTREMITY MMT:    MMT Right Eval Left Eval  Hip flexion 4+ 5  Hip extension    Hip abduction 4 4  Hip adduction 5 5  Hip internal rotation    Hip external rotation    Knee flexion 5 5  Knee extension 5 4+  Ankle dorsiflexion    Ankle plantarflexion    Ankle inversion    Ankle eversion    (Blank rows = not tested)  All tested in sitting   BED MOBILITY:  Pt reports slower movements getting in and out of the bed   TRANSFERS: Sit to stand:  Modified independence  Assistive device utilized: None     Stand to sit: Modified independence  Assistive device utilized: None      Pt with decr forward lean   GAIT: Findings: Gait Characteristics: step through pattern, decreased arm swing- Right, decreased arm swing- Left, decreased stride length, decreased trunk rotation, and trunk flexed, Distance walked: Clinic distances , Assistive device utilized:None, Level of assistance: Modified independence, and Comments: incr forward head, decr arm swing RUE>LUE  Pt reports his walking feels different, not as relaxed and comfortable   FUNCTIONAL TESTS:  5 times sit to stand: 11.1 seconds with no UE support  10 meter walk test: 9.5 seconds with no AD = 3.45 ft/sec                                                                                                                                TREATMENT DATE: 12/17/23  Therapeutic Activity:  Vitals:   12/17/23 1022  BP: (!) 160/68  Pulse: 68    BP taken in sitting and WNL for treatment today. Pt wearing compression socks and drinking water Pt asking about strategies when holding binder when singing at church as this has been very challenging, pt demonstrating how  he performs with having elbows away from body and holding binder with R finger pads. Educated pt to keep elbows in to body when holding and to also think about opening up R hand BIG before holding binder. Pt to try this the next time he sings at church    NMR: Working on posture strengthening and mobility due to incr thoracic kyphosis and forward head posture:  PWR Up in prone position 2 x 10 reps - cues to keep hips down on mat table and to try to gently extend neck as able. Pt needing a rest break between reps due to fatigue as pt not used to working his upper back postural stabilizers  Quadruped cat/cow - pt with very limited thoracic and neck extension, pt trying to perform in his range, pt reporting this feels good and feels that  it will be beneficial to work on, cues to also try to keep elbows extended, 10 reps  Quadruped PWR Twist 10 reps - cued for incr twist for thoracic rotation, pt did demo improved ROM with incr reps  Seated cat/cow 10 reps, pt prefers to do it in quadruped position, but discussed can try this during the [place] for spinal mobility and easier to perform when seating   Provided education how PD affects posture and also needing to be more aware of sitting up more erect during gait/sitting/standing. Also trying to read things at eye level to prevent incr forward flexed posture. Educated of purpose of posture exercises and how they won't make posture go back to normal (per pt's wife, pt has had more forward posture for ~4 years), but to work on preventing it from getting worse and strengthening his posture muscles/mobility.   PATIENT EDUCATION: Education details: see above, education of posture exercises for PD, addition of above exercises to HEP to address posture, plan to D/C at next session  Person educated: Patient and Spouse Education method: Explanation, Demonstration, and Handouts Education comprehension: verbalized understanding and returned demonstration  HOME EXERCISE PROGRAM: Standing PWR Moves  Color and direction chart to be completed at counter and someone accompanying you  Access Code: [email or web address] URL: [email or web address] Date: 12/17/2023 Prepared by: Sheffield Senate  Exercises - Standing Marching  - 1 x daily - 5 x weekly - 3 sets - Quadruped Full Range Thoracic Rotation with Reach  - 1 x daily - 5 x weekly - 2 sets - 10 reps - Cat Cow  - 1 x daily - 5 x weekly - 2 sets - 10 reps -prone PWR UP  GOALS: Goals reviewed with patient? Yes  SHORT TERM GOALS:  ALL STGS = LTGS   LONG TERM GOALS: Target date: 10/27/2023 UPDATED LTG DATE FOR RE-CERT: 88/85/74  Pt will be independent with final HEP for strength, gait, balance, posture in order to build upon functional  gains made in therapy. Baseline: will benefit from updates/additions  Goal status: ON-GOING  2.  Pt will improve miniBEST to at least a 25/28 in order to demo decr fall risk.  Baseline: 23/28  26/28 (11/13) Goal status: MET  3.  Pt will verbalize understanding of transition to appropriate community fitness classes upon d/c from PT to maximize gains made in therapy.  Baseline: provided community resources, pt looking for appropriate classes near where pt lives near [place] and plans to get his own stationary bike  Goal status: MET  LONG TERM GOALS: Target date: 12/25/2023  Pt will be independent with final HEP for strength, gait, balance, posture in order to  build upon functional gains made in therapy. Baseline: will benefit from updates/additions  Goal status: ON-GOING    ASSESSMENT:  CLINICAL IMPRESSION:  Today's skilled session focused on working on exercises for posture, spinal mobility, thoracic/cervical extension in quadruped and prone positions. Pt was challenged by prone position due to not being used to working his upper back muscles in this position. Pt does have incr thoracic kyphosis and forward head posture with very limited thoracic and cervical extension. Provided exercises to HEP to address posture in quadruped/prone positions to work against gravity (pt also has supine posture exercises at home). Will plan to D/C at next session with plan for PD screens in 6 months. Pt and pt's spouse in agreement with plan.   OBJECTIVE IMPAIRMENTS: Abnormal gait, decreased activity tolerance, decreased balance, decreased coordination, difficulty walking, decreased ROM, decreased strength, impaired flexibility, and postural dysfunction.   ACTIVITY LIMITATIONS: bending, transfers, bed mobility, and locomotion level  PARTICIPATION LIMITATIONS: community activity  PERSONAL FACTORS: Age, Behavior pattern, Past/current experiences, Time since onset of injury/illness/exacerbation, and  1-2 comorbidities: HLD, BPH are also affecting patient's functional outcome.   REHAB POTENTIAL: Good  CLINICAL DECISION MAKING: Evolving/moderate complexity  EVALUATION COMPLEXITY: Moderate  PLAN:  PT FREQUENCY: 2x/week  PT DURATION: 6 weeks - due to delay in scheduling, anticipate just 8 PT visits for 4 weeks  Plus an additional 1x week for 4 weeks for re-cert   PLANNED INTERVENTIONS: 97164- PT Re-evaluation, 97110-Therapeutic exercises, 97530- Therapeutic activity, [id number]- Neuromuscular re-education, 97535- Self Care, 02859- Manual therapy, [phone]- Gait training, Patient/Family education, Balance training, and Stair training  PLAN FOR NEXT SESSION:  10th VISIT PN AND D/C! Work more on posture, make sure pt feels good about HEP    Sheffield LOISE Senate, PT, DPT 12/17/2023, 10:59 AM

## 2023-12-17 NOTE — Therapy (Signed)
 OUTPATIENT OCCUPATIONAL THERAPY NEURO TREATMENT Patient Name: URIAS SHEEK MRN: [id number] DOB:1940/08/05, 83 y.o., male Today's Date: 12/17/2023  PCP: [name] Santos, MD REFERRING PROVIDER: [name] Santos, MD  END OF SESSION:  OT End of Session - 12/17/23 0927     Visit Number 12    Number of Visits 17    Date for Recertification  01/13/24    Authorization Type [employer] MCR approved 8 O.T. visits 9/16 - 11/24/23, approved 7 additional visits from 11/26/23 - 01/14/24    Authorization Time Period visit limit [place]    Authorization - Visit Number 3    Authorization - Number of Visits 7    Progress Note Due on Visit 10    OT Start Time 0925    OT Stop Time 1015    OT Time Calculation (min) 50 min    Equipment Utilized During Treatment Green putty, wrist weights    Activity Tolerance Patient tolerated treatment well    Behavior During Therapy WFL for tasks assessed/performed         Past Medical History:  Diagnosis Date   BPH (benign prostatic hyperplasia)    Chronic kidney disease    Dizziness    Hyperlipidemia    Pneumonia    march 2018   Stage III chronic kidney disease (HCC)    Past Surgical History:  Procedure Laterality Date   CATARACT EXTRACTION Right    jan 2013   CATARACT EXTRACTION Left    feb 2013   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   ONSET DATE: 09/07/2023  REFERRING DIAG: R29.898 (ICD-10-CM) - Other symptoms and signs involving the musculoskeletal system  THERAPY DIAG:  Other symptoms and signs involving the nervous system  Unsteadiness on feet  Abnormal posture  Other lack of coordination  Other symptoms and signs involving the musculoskeletal system  Rationale for Evaluation and Treatment: Rehabilitation  SUBJECTIVE:   SUBJECTIVE STATEMENT: Pt reports everything going well  Pt accompanied by:  significant other   PERTINENT HISTORY: Pt is an 83 year old man who has had progressive worsening of gait, orthostatic hypotension, and a right arm tremor. Concerns noted at most recent office visit with [facility] on 07/20/23 for Parkinson's but did not fulfill enough criteria at that time (normal muscle tone, no gait retropulsion, no definite bradykinesia). Noted less physical endurance and worsening RUE tremor when completing tasks such as toileting, holding feeding utensils, and writing. Pt was receiving SLP services at this location from 07/03/23-09/08/23  PRECAUTIONS: Fall  WEIGHT BEARING RESTRICTIONS: No  PAIN:  Are you having pain? No  FALLS: Has patient fallen in last 6 months? No  LIVING ENVIRONMENT: Lives with: lives with their spouse Lives in: [place] home, level entry, 5 STE to bedroom on left side going down and rail, den 8 on left side going down Has following equipment at home: Grab bars in walk in shower, high toilet seat  PLOF: Independentneeds some help with buttons, jackets, slower with self feeding, more difficulty recently with wiping  PATIENT GOALS: I would like to improve my handwriting and strengthen my R arm.   OBJECTIVE:  Note: Objective measures were completed at Evaluation unless otherwise noted.  HAND DOMINANCE: Right  ADLs: Overall ADLs: I/Mod I with ADLs, reported increased time with buttons and putting on  jacket Transfers/ambulation related to ADLs: Eating: slower eating Grooming: I UB Dressing: difficulty/slower w/ buttons LB Dressing: I Toileting: slightly slower with wiping  Bathing: I/Mod I Tub Shower transfers: I Equipment: Grab bars and Walk in shower  IADLs: Shopping: I Light housekeeping: I Meal Prep: spouse does cooking, pt cleans Community mobility: I Medication management: Mod I with pill organizer, no difficulty manipulating pills Financial management: I Handwriting: 95-100% legible in print, Mild  micrographia, Moderate micrographia, and some spacing issues noted as the words are fairly close together. Reports writing print and micrographia getting worse with fatigue.  MOBILITY STATUS: Independent is still driving, some stiffness in neck reported. Slower getting seatbelt on per spouse report.   POSTURE COMMENTS:  rounded shoulders and forward head Sitting balance: WFL  ACTIVITY TOLERANCE: Activity tolerance: Reports some increased fatigue, unsure if d/t old age or Parkinson's.  FUNCTIONAL OUTCOME MEASURES: Simulated eating (PPT#2) in R dominant hand: 11.70 seconds Simulated donning/doffing jacket (PPT #4): 20.85 seconds  3 button/unbutton: 27.28 seconds   UPPER EXTREMITY ROM:  WNL, some cueing required to fully extend elbows when flexing BUEs to 90 degrees.  UPPER EXTREMITY MMT:   LUE WNL  MMT Right eval Left eval  Shoulder flexion 4   Elbow flexion 4   Elbow extension    (Blank rows = not tested)  HAND FUNCTION: Grip strength: Right: 57.3 lbs; Left: 52.6 lbs  COORDINATION: 9 Hole Peg test: Right: 24.80 sec; Left: 33.96 sec 11/20/23- Right: 25.4, 28.6 sec; Left: 24.9sec  Box and Blocks:  Right 49 blocks, Left 50 blocks- noted pt at times did not clear partition. 11/20/2023: Box and Blocks:  Right 52 blocks, Left 51 blocks (notable smaller movements as test went on)  Tremors: Resting and Right Simulated eating (PPT#2) in R dominant hand: 11.70 seconds Simulated donning/doffing jacket (PPT #4): 20.85 seconds  3 button/unbutton: 27.28 seconds  11/20/23: 3 button/unbutton: 23.8 seconds   SENSATION: WFL  EDEMA: swelling in B ankles, PT aware.  MUSCLE TONE: RUE: slight catch, minimal cogwheel ridigity   COGNITION: Overall cognitive status: Within functional limits for tasks assessed  VISION: Subjective report: Slight changes in vision over the years.  Baseline vision: Wears glasses all the time Visual history: cataract surgery  VISION  ASSESSMENT: WFL  Patient has difficulty with following activities due to following visual impairments:  PERCEPTION: Not tested  PRAXIS: Not tested  OBSERVATIONS: Concerns with handwriting noted and some slight weakness noted in RUE. Some stiffness noted as well and mild cogwheel rigidity.                                                                                                                           TREATMENT DATE:   Pt standing on compliant surface (AirEx) to place small pegs on pegboard on vertical surface for coordination and high level reaching. Performing bilaterally w/ trunk rotation and cross body reaching implemented. Cues required to keep uninvolved hand relaxed and down as pt tends to  flex elbow and MP's.   PWR! Step patterns forwards, side, and backwards with min difficulty. Adding UE involvement with mod difficulty. Adding cognitive reciprocal inhibition with max difficulty, LOB, and inability to dual task. Modified for simpler cognitive task to decrease motor/physical deficits.  Pt also required cues to keep palms forward for shoulder ER w/ PWR! Step - simplified to do same arm motions with PWR! Step forwards and backwards with greater success.   Discussed/reviewed ways to challenge himself cognitively and by dual tasking activities that are safe   PATIENT EDUCATION: Education details: see above Person educated: Patient and Spouse Education method: Explanation, Demonstration, and Verbal cues Education comprehension: verbalized understanding, returned demonstration, and verbal cues required  HEP's 10/06/23: PWR! Sitting 10/12/23: PWR! Hands and coordination HEP  10/23/23: Handwriting strategies 11/12/23: ADL strategies 11/17/23: PWR! Up, Twist, Step in Supine, sh flex (supine), PD ex chart 11/20/23: Buttoning handout, green putty exercises- Access code ([email or web address] ) 11/24/23: ways to prevent future PD related complications, ways to keep thinking skills  sharp  GOALS: Goals reviewed with patient? Yes  SHORT TERM GOALS: Target date: 10/29/23 GOALS:  SHORT TERM GOALS: Target date: 10/29/23    Pt will be independent with PD specific HEP.  Baseline: not yet initiated Goal status: MET  2.  Pt will verbalize understanding of adapted strategies to maximize safety and independence with ADLs/IADLs.  Baseline: not yet initiated Goal status: MET   3.  Pt will write a sentence with no significant decrease in size and maintain 95% legibility and spacing.   Baseline: 95-100% legibility Goal status: MET  4.  Pt will demonstrate improved fine motor coordination for ADLs as evidenced by decreasing 9 hole peg test score for L hand by at least 5 seconds.  Baseline: 33.96 seconds 11/20/23- Right: 25.4, 28.6 sec; Left: 24.9sec Goal status:MET- 9 second decrease of L hand  5.  Pt will demonstrate improved ease with fastening buttons as evidenced by decreasing 3 button/unbutton time to 25 seconds or less.  Baseline: 27.85 seconds 11/20/23- 3 button/unbutton: 23.8 seconds  Goal status: MET  6.  Pt will be able to place at least 3 or more blocks using bilateral hand with completion of Box and Blocks test. Baseline: 49 R hand, 50 L hand blocks 11/20/2023: Right 52 blocks, Left 51 blocks (notable smaller movements as test went on) Goal status: PARTIALLY MET (RUE)   LONG TERM GOALS: Extended through 01/13/24 for unmet/revised goals   Pt will verbalize understanding of ways to prevent future PD related complications and PD community resources.  Baseline: not yet initiated Goal status: MET  2.  Pt will write a short paragraph with no significant decrease in size and maintain 100% legibility.  Baseline: 95-100% legibility with spacing concerns Goal status: IN PROGRESS (11/26/23 - 95% legibility maintaining size except 1 word)   3.  Pt will verbalize understanding of ways to keep thinking skills sharp and ways to compensate for STM changes in the  future.  Baseline: not yet initiated Goal status: MET   4.  Pt will demonstrate increased ease with dressing as evidenced by decreasing PPT#4 (don/ doff jacket) to 16 secs or less.  Baseline: 20.85 seconds Goal status: IN PROGRESS  5. Pt will improve RUE elbow extension without cues for reaching tasks   Baseline: requires cues  Goal status: REVISED   ASSESSMENT:  CLINICAL IMPRESSION: Pt has met all STG's and 2 LTGs. Pt progressing towards remaining LTG's. Pt will continue to benefit from skilled O.T. to  address remaining deficits and unmet/revised LTG's. Pt doing well overall with HEP's and functional difference noted per pt. Pt still requires min cues for large amplitude movements but responds well after initial cueing.   PERFORMANCE DEFICITS: in functional skills including ADLs, coordination, tone, strength, Fine motor control, and tremors.   IMPAIRMENTS: are limiting patient from ADLs and IADLs.   CO-MORBIDITIES: may have co-morbidities  that affects occupational performance. Patient will benefit from skilled OT to address above impairments and improve overall function.  REHAB POTENTIAL: Good   PLAN:  OT FREQUENCY: 1x/week  OT DURATION: 6 weeks (for renewal period)   PLANNED INTERVENTIONS: 97168 OT Re-evaluation, 97535 self care/ADL training, 02889 therapeutic exercise, 97530 therapeutic activity, 97112 neuromuscular re-education, 97140 manual therapy, 97039 fluidotherapy, 97010 moist heat, 97760 Orthotic Initial, 97763 Orthotic/Prosthetic subsequent, functional mobility training, visual/perceptual remediation/compensation, energy conservation, coping strategies training, patient/family education, and DME and/or AE instructions  RECOMMENDED OTHER SERVICES: none  CONSULTED AND AGREED WITH PLAN OF CARE: Patient and family member/caregiver  PLAN FOR NEXT SESSION:  Scarf activity w/ cognitive and/or physical challenge UBE  Burnard JINNY Roads, OT 12/17/2023, 9:28  AM

## 2023-12-18 ENCOUNTER — Encounter: Payer: Self-pay | Admitting: Cardiovascular Disease

## 2023-12-18 ENCOUNTER — Ambulatory Visit: Admitting: Cardiovascular Disease

## 2023-12-23 NOTE — Telephone Encounter (Signed)
 I filled the form during his wife's office visit.

## 2023-12-24 ENCOUNTER — Encounter: Payer: Self-pay | Admitting: Physical Therapy

## 2023-12-24 ENCOUNTER — Ambulatory Visit: Admitting: Occupational Therapy

## 2023-12-24 ENCOUNTER — Ambulatory Visit: Admitting: Physical Therapy

## 2023-12-24 ENCOUNTER — Encounter: Payer: Self-pay | Admitting: Occupational Therapy

## 2023-12-24 VITALS — BP 160/71 | HR 70

## 2023-12-24 DIAGNOSIS — R29818 Other symptoms and signs involving the nervous system: Secondary | ICD-10-CM

## 2023-12-24 DIAGNOSIS — R29898 Other symptoms and signs involving the musculoskeletal system: Secondary | ICD-10-CM

## 2023-12-24 DIAGNOSIS — R2681 Unsteadiness on feet: Secondary | ICD-10-CM

## 2023-12-24 DIAGNOSIS — R278 Other lack of coordination: Secondary | ICD-10-CM

## 2023-12-24 DIAGNOSIS — R293 Abnormal posture: Secondary | ICD-10-CM

## 2023-12-24 DIAGNOSIS — R2689 Other abnormalities of gait and mobility: Secondary | ICD-10-CM

## 2023-12-24 DIAGNOSIS — M6281 Muscle weakness (generalized): Secondary | ICD-10-CM

## 2023-12-24 NOTE — Therapy (Signed)
 OUTPATIENT OCCUPATIONAL THERAPY NEURO TREATMENT/DISCHARGE  Patient Name: Carl Nichols MRN: [id number] DOB:05/11/1940, 83 y.o., male Today's Date: 12/24/2023  PCP: Avva, Ravisankar, MD REFERRING PROVIDER: Avva, Ravisankar, MD  OCCUPATIONAL THERAPY DISCHARGE SUMMARY  Visits from Start of Care: 13  Current functional level related to goals / functional outcomes: SEE BELOW GOAL SECTION   Remaining deficits: Bradykinesia/hypokinesia Posture   Education / Equipment: See below education/HEP section   Patient agrees to discharge. Patient goals were partially met. Patient is being discharged due to being pleased with the current functional level..     END OF SESSION:  OT End of Session - 12/24/23 0925     Visit Number 13    Number of Visits 17    Date for Recertification  01/13/24    Authorization Type [employer] MCR approved 8 O.T. visits 9/16 - 11/24/23, approved 7 additional visits from 11/26/23 - 01/14/24    Authorization Time Period visit limit [place]    Authorization - Visit Number 4    Authorization - Number of Visits 7    Progress Note Due on Visit 10    OT Start Time 0925    OT Stop Time 1010    OT Time Calculation (min) 45 min    Equipment Utilized During Treatment Green putty, wrist weights    Activity Tolerance Patient tolerated treatment well    Behavior During Therapy WFL for tasks assessed/performed         Past Medical History:  Diagnosis Date   BPH (benign prostatic hyperplasia)    Chronic kidney disease    Dizziness    Hyperlipidemia    Pneumonia    march 2018   Stage III chronic kidney disease (HCC)    Past Surgical History:  Procedure Laterality Date   CATARACT EXTRACTION Right    jan 2013   CATARACT EXTRACTION Left    feb 2013   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   ONSET DATE: 09/07/2023  REFERRING  DIAG: R29.898 (ICD-10-CM) - Other symptoms and signs involving the musculoskeletal system  THERAPY DIAG:  Other symptoms and signs involving the nervous system  Unsteadiness on feet  Other lack of coordination  Other symptoms and signs involving the musculoskeletal system  Muscle weakness (generalized)  Rationale for Evaluation and Treatment: Rehabilitation  SUBJECTIVE:   SUBJECTIVE STATEMENT: I start [employer] boxing on Monday. No pain and no falls  Pt accompanied by: significant other   PERTINENT HISTORY: Pt is an 83 year old man who has had progressive worsening of gait, orthostatic hypotension, and a right arm tremor. Concerns noted at most recent office visit with [facility] on 07/20/23 for Parkinson's but did not fulfill enough criteria at that time (normal muscle tone, no gait retropulsion, no definite bradykinesia). Noted less physical endurance and worsening RUE tremor when completing tasks such as toileting, holding feeding utensils, and writing. Pt was receiving SLP services at this location from 07/03/23-09/08/23  PRECAUTIONS: Fall  WEIGHT BEARING RESTRICTIONS: No  PAIN:  Are you having pain? No  FALLS: Has patient fallen in last 6 months? No  LIVING ENVIRONMENT: Lives with: lives with their spouse Lives in: [place] home, level entry, 5 STE to bedroom on left side going down and rail, den 8 on left side going down Has following equipment  at home: Grab bars in walk in shower, high toilet seat  PLOF: Independentneeds some help with buttons, jackets, slower with self feeding, more difficulty recently with wiping  PATIENT GOALS: I would like to improve my handwriting and strengthen my R arm.   OBJECTIVE:  Note: Objective measures were completed at Evaluation unless otherwise noted.  HAND DOMINANCE: Right  ADLs: Overall ADLs: I/Mod I with ADLs, reported increased time with buttons and putting on jacket Transfers/ambulation related to  ADLs: Eating: slower eating Grooming: I UB Dressing: difficulty/slower w/ buttons LB Dressing: I Toileting: slightly slower with wiping  Bathing: I/Mod I Tub Shower transfers: I Equipment: Grab bars and Walk in shower  IADLs: Shopping: I Light housekeeping: I Meal Prep: spouse does cooking, pt cleans Community mobility: I Medication management: Mod I with pill organizer, no difficulty manipulating pills Financial management: I Handwriting: 95-100% legible in print, Mild micrographia, Moderate micrographia, and some spacing issues noted as the words are fairly close together. Reports writing print and micrographia getting worse with fatigue.  MOBILITY STATUS: Independent is still driving, some stiffness in neck reported. Slower getting seatbelt on per spouse report.   POSTURE COMMENTS:  rounded shoulders and forward head Sitting balance: WFL  ACTIVITY TOLERANCE: Activity tolerance: Reports some increased fatigue, unsure if d/t old age or Parkinson's.  FUNCTIONAL OUTCOME MEASURES: Simulated eating (PPT#2) in R dominant hand: 11.70 seconds Simulated donning/doffing jacket (PPT #4): 20.85 seconds  3 button/unbutton: 27.28 seconds   UPPER EXTREMITY ROM:  WNL, some cueing required to fully extend elbows when flexing BUEs to 90 degrees.  UPPER EXTREMITY MMT:   LUE WNL  MMT Right eval Left eval  Shoulder flexion 4   Elbow flexion 4   Elbow extension    (Blank rows = not tested)  HAND FUNCTION: Grip strength: Right: 57.3 lbs; Left: 52.6 lbs  COORDINATION: 9 Hole Peg test: Right: 24.80 sec; Left: 33.96 sec 11/20/23- Right: 25.4, 28.6 sec; Left: 24.9sec  Box and Blocks:  Right 49 blocks, Left 50 blocks- noted pt at times did not clear partition. 11/20/2023: Box and Blocks:  Right 52 blocks, Left 51 blocks (notable smaller movements as test went on)  Tremors: Resting and Right Simulated eating (PPT#2) in R dominant hand: 11.70 seconds Simulated donning/doffing jacket (PPT  #4): 20.85 seconds  3 button/unbutton: 27.28 seconds  11/20/23: 3 button/unbutton: 23.8 seconds   SENSATION: WFL  EDEMA: swelling in B ankles, PT aware.  MUSCLE TONE: RUE: slight catch, minimal cogwheel ridigity   COGNITION: Overall cognitive status: Within functional limits for tasks assessed  VISION: Subjective report: Slight changes in vision over the years.  Baseline vision: Wears glasses all the time Visual history: cataract surgery  VISION ASSESSMENT: WFL  Patient has difficulty with following activities due to following visual impairments:  PERCEPTION: Not tested  PRAXIS: Not tested  OBSERVATIONS: Concerns with handwriting noted and some slight weakness noted in RUE. Some stiffness noted as well and mild cogwheel rigidity.  TREATMENT DATE:   Practiced donning/doffing puffer vest - pt has a hard time doing cape method even after multiple attempts. Pt did better with X  method or flip method.   Pt writing paragraph with 100% legibility maintaining size w/ cues to slow down  Tossing scarf with ambulation performing category generation for cognitive challenge w/ mod difficulty multi-tasking and occasional LOB when cognitively challenged.   Discussed D/C today - pt is doing very well with HEP's and will start [employer] boxing on Monday.  Pt instructed to practice donning/doffing jacket   UBE x 5 mintues, level 3.5 for UE strength/endurance maintaining 40 or higher RPM    PATIENT EDUCATION: Education details: see above Person educated: Patient and Spouse Education method: Explanation, Demonstration, and Verbal cues Education comprehension: verbalized understanding, returned demonstration, and verbal cues required  HEP's 10/06/23: PWR! Sitting 10/12/23: PWR! Hands and coordination HEP  10/23/23: Handwriting strategies 11/12/23: ADL  strategies 11/17/23: PWR! Up, Twist, Step in Supine, sh flex (supine), PD ex chart 11/20/23: Buttoning handout, green putty exercises- Access code ([email or web address] ) 11/24/23: ways to prevent future PD related complications, ways to keep thinking skills sharp  GOALS: Goals reviewed with patient? Yes  SHORT TERM GOALS: Target date: 10/29/23 GOALS:  SHORT TERM GOALS: Target date: 10/29/23    Pt will be independent with PD specific HEP.  Baseline: not yet initiated Goal status: MET  2.  Pt will verbalize understanding of adapted strategies to maximize safety and independence with ADLs/IADLs.  Baseline: not yet initiated Goal status: MET   3.  Pt will write a sentence with no significant decrease in size and maintain 95% legibility and spacing.   Baseline: 95-100% legibility Goal status: MET  4.  Pt will demonstrate improved fine motor coordination for ADLs as evidenced by decreasing 9 hole peg test score for L hand by at least 5 seconds.  Baseline: 33.96 seconds 11/20/23- Right: 25.4, 28.6 sec; Left: 24.9sec Goal status:MET- 9 second decrease of L hand  5.  Pt will demonstrate improved ease with fastening buttons as evidenced by decreasing 3 button/unbutton time to 25 seconds or less.  Baseline: 27.85 seconds 11/20/23- 3 button/unbutton: 23.8 seconds  Goal status: MET  6.  Pt will be able to place at least 3 or more blocks using bilateral hand with completion of Box and Blocks test. Baseline: 49 R hand, 50 L hand blocks 11/20/2023: Right 52 blocks, Left 51 blocks (notable smaller movements as test went on) Goal status: PARTIALLY MET (RUE)   LONG TERM GOALS: Extended through 01/13/24 for unmet/revised goals   Pt will verbalize understanding of ways to prevent future PD related complications and PD community resources.  Baseline: not yet initiated Goal status: MET  2.  Pt will write a short paragraph with no significant decrease in size and maintain 100% legibility.  Baseline:  95-100% legibility with spacing concerns Goal status: MET   3.  Pt will verbalize understanding of ways to keep thinking skills sharp and ways to compensate for STM changes in the future.  Baseline: not yet initiated Goal status: MET   4.  Pt will demonstrate increased ease with dressing as evidenced by decreasing PPT#4 (don/ doff jacket) to 16 secs or less.  Baseline: 20.85 seconds Goal status: PARTIALLY MET (able to get 13 sec. Using X method but not consistent)   5. Pt will improve RUE elbow extension without cues for reaching tasks   Baseline: requires cues  Goal status: NOT MET - occasionally requires cues  ASSESSMENT:  CLINICAL IMPRESSION: Pt has met all STG's and 4/5 LTGs. Pt doing well with HEP's and begins [employer] boxing next week  PERFORMANCE DEFICITS: in functional skills including ADLs, coordination, tone, strength, Fine motor control, and tremors.   IMPAIRMENTS: are limiting patient from ADLs and IADLs.   CO-MORBIDITIES: may have co-morbidities  that affects occupational performance. Patient will benefit from skilled OT to address above impairments and improve overall function.  REHAB POTENTIAL: Good   PLAN:  OT FREQUENCY: 1x/week  OT DURATION: 6 weeks (for renewal period)   PLANNED INTERVENTIONS: 97168 OT Re-evaluation, 97535 self care/ADL training, 02889 therapeutic exercise, 97530 therapeutic activity, 97112 neuromuscular re-education, 97140 manual therapy, 97039 fluidotherapy, 97010 moist heat, 97760 Orthotic Initial, 97763 Orthotic/Prosthetic subsequent, functional mobility training, visual/perceptual remediation/compensation, energy conservation, coping strategies training, patient/family education, and DME and/or AE instructions  RECOMMENDED OTHER SERVICES: none  CONSULTED AND AGREED WITH PLAN OF CARE: Patient and family member/caregiver  PLAN   D/C O.T. - PD screens in 6 months  Burnard JINNY Roads, OT 12/24/2023, 9:27 AM

## 2023-12-24 NOTE — Therapy (Signed)
 OUTPATIENT PHYSICAL THERAPY NEURO TREATMENT/10th VISIT PN/DISCHARGE SUMMARY   Patient Name: Carl Nichols MRN: [id number] DOB:[birth year], 83 y.o., male Today's Date: 12/24/2023   PCP: [name] Santos, MD  REFERRING PROVIDER: [name] Santos, MD  10th Visit Physical Therapy Progress Note  Dates of Reporting Period: 09/29/23 to 12/24/23     PHYSICAL THERAPY DISCHARGE SUMMARY  Visits from Start of Care: 10  Current functional level related to goals / functional outcomes: See LTGs/Clinical Assessment Statement    Remaining deficits: Impaired high level balance, bradykinesia, postural abnormalities   Education / Equipment: HEP, PD education, BP education with orthostatics, community resources    Patient agrees to discharge. Patient goals were met. Patient is being discharged due to meeting the stated rehab goals. PD screens scheduled in 6 months    END OF SESSION:  PT End of Session - 12/24/23 1019     Visit Number 10    Number of Visits 10    Date for Recertification  12/26/23   per re-cert on 88/86/74   Authorization Type [employer]    PT Start Time 1017    PT Stop Time 1053   full time not used due to D/C visit   PT Time Calculation (min) 36 min    Equipment Utilized During Treatment Gait belt    Activity Tolerance Patient tolerated treatment well    Behavior During Therapy WFL for tasks assessed/performed               Past Medical History:  Diagnosis Date   BPH (benign prostatic hyperplasia)    Chronic kidney disease    Dizziness    Hyperlipidemia    Pneumonia    march 2018   Stage III chronic kidney disease (HCC)    Past Surgical History:  Procedure Laterality Date   CATARACT EXTRACTION Right    jan 2013   CATARACT EXTRACTION Left    feb 2013   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   ONSET DATE: 08/28/2023  REFERRING DIAG: R26.0 (ICD-10-CM) - Staggering gait  THERAPY DIAG:  Unsteadiness on feet  Abnormal posture  Other abnormalities of gait and mobility  Rationale for Evaluation and Treatment: Rehabilitation  SUBJECTIVE:  SUBJECTIVE STATEMENT: Will be starting RSB next Monday, will be going 3 days a week. Got clearance from his cardiologist. Reports his R side feels weaker due to the PD. Still challenged by the PWR moves when reaching. Needs to be more diligent about the posture exercises at home.   Pt accompanied by: Wife, [name]   PERTINENT HISTORY: PMH: HLD, BPH  R arm tremors and difficulty with gait  Orthostatic hypotension   He feels symptoms started about 6 months ago and has mildly worsened. His wife notes his gait has slowed over the past 6 months.   PAIN:  Are you having pain? No  PRECAUTIONS: Other: Orthostatic hypotension    FALLS: Has patient fallen in last 6 months? 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  DIAGNOSTIC FINDINGS: MRI brain 08/13/23: IMPRESSION: Normal  COGNITION: Overall cognitive status: Within functional limits for tasks assessed   SENSATION: Light touch: WFL, but pt reporting inside of his calf felt  different than the outside   COORDINATION: Heel to shin: slightly bradykinetic with RLE   OBSERVATION: RUE tremor   EDEMA:  Mild swelling to bilateral ankles   POSTURE: rounded shoulders, forward head, and increased thoracic kyphosis  LOWER EXTREMITY ROM:    Decr knee extension AROM   LOWER EXTREMITY MMT:    MMT Right Eval Left Eval  Hip flexion 4+ 5  Hip extension    Hip abduction 4 4  Hip adduction 5 5  Hip internal rotation    Hip external rotation    Knee flexion 5 5  Knee extension 5 4+  Ankle dorsiflexion    Ankle plantarflexion    Ankle inversion    Ankle eversion    (Blank rows = not tested)  All tested in sitting   BED MOBILITY:  Pt reports slower movements getting in and out of the bed   TRANSFERS: Sit to stand: Modified independence  Assistive device utilized: None     Stand to sit: Modified independence  Assistive device utilized: None      Pt with decr forward lean   GAIT: Findings: Gait Characteristics: step through pattern, decreased arm swing- Right, decreased arm swing- Left, decreased stride length, decreased trunk rotation, and trunk flexed, Distance walked: Clinic distances , Assistive device utilized:None, Level of assistance: Modified independence, and Comments: incr forward head, decr arm swing RUE>LUE  Pt reports his walking feels different, not as relaxed and comfortable   FUNCTIONAL TESTS:  5 times sit to stand: 11.1 seconds with no UE support  10 meter walk test: 9.5 seconds with no AD = 3.45 ft/sec   TUG: 9.6 seconds  TREATMENT DATE: 12/24/23  Therapeutic Activity:   Vitals:   12/24/23 1026  BP: (!) 160/71  Pulse: 70   BP taken in sitting and WNL for treatment today. Pt wearing compression socks and drinking water   Reviewed strategies from last session when holding binder when singing at church as  this has been very challenging -educated pt to keep elbows in to body when holding and to also think about opening up R hand BIG before holding binder. Wrote this down on a sticky note for pt to have in his binder as a reminder   Reviewed education how PD affects posture and also needing to be more aware of sitting up more erect during gait/sitting/standing. Also trying to read things at eye level to prevent incr forward flexed posture, or using a book stand/eisel when reading or using his iPAD to keep everyday tasks more at eye level    NMR: Reviewed standing PWR Rock at [employer] using sticky notes on a cabinet as visual cue for reaching (as pt's spouse reports he normally does not look up at his hands as much), performed 10 reps each side, pt did well with external visual cue to reach and turn head/look at with eyes. Does also need initial cues to widen BOS and reach more laterally for more weight shift compared to just reaching straight up. Pt also does demo improved elbow extension with use of visual target   Standing PWR Flow 5 reps  Initial demo cues and then pt trying to perform with intermittent verbal cues of what movement is next, pt with tendency to get Rock/Step mixed up, but able to verbalize and then demo proper technique Discussed this is a way he can also make PWR moves a little harder at home by adding a cognitive challenge   PATIENT EDUCATION: Education details: Continue HEP, can perform standing PWR Flow as a way to make it more harder from a cognitive perspective, D/C from PT with plan for PD screens in 6 months  Person educated: Patient and Spouse Education method: Explanation, Demonstration, and Handouts Education comprehension: verbalized understanding and returned demonstration  HOME EXERCISE PROGRAM: Standing PWR Moves  Color and direction chart to be completed at counter and someone accompanying you  Access Code: [email or web address] URL:  [email or web address] Date: 12/17/2023 Prepared by: Sheffield Senate  Exercises - Standing Marching  - 1 x daily - 5 x weekly - 3 sets - Quadruped Full Range Thoracic Rotation with Reach  - 1 x daily - 5 x weekly - 2 sets - 10 reps - Cat Cow  - 1 x daily - 5 x weekly - 2 sets - 10 reps -prone PWR UP  GOALS: Goals reviewed with patient? Yes  SHORT TERM GOALS:  ALL STGS = LTGS   LONG TERM GOALS: Target date: 10/27/2023 UPDATED LTG DATE FOR RE-CERT: 88/85/74  Pt will be independent with final HEP for strength, gait, balance, posture in order to build upon functional gains made in therapy. Baseline: will benefit from updates/additions  Goal status: ON-GOING  2.  Pt will improve miniBEST to at least a 25/28 in order to demo decr fall risk.  Baseline: 23/28  26/28 (11/13) Goal status: MET  3.  Pt will verbalize understanding of transition to appropriate community fitness classes upon d/c from PT to maximize gains made in therapy.  Baseline: provided community resources, pt looking for appropriate classes near where pt lives near [place] and plans to get his own stationary bike  Goal status: MET  LONG TERM GOALS: Target date: 12/25/2023  Pt will be independent with final HEP for strength, gait, balance, posture in order to build upon functional gains made in therapy. Baseline: will benefit from updates/additions  Goal status: MET   ASSESSMENT:  CLINICAL IMPRESSION:  Today's skilled session focused on reviewing strategies for posture and final HEP. Reviewed standing PWR moves and performing standing PWR Flow as a way to make it more cognitively challenging. Pt did well with use of sticky notes on cabinet as visual cue for larger amplitude movements/head movement when performing standing PWR Rock. Pt plans on starting RSB next week and also has a recumbent bike for home for aerobic exercise. Pt has met his goals and is compliant with HEP. Pt will be discharged at this  time with PD screens in 6 months. Pt and pt's spouse in agreement with plan.   OBJECTIVE IMPAIRMENTS: Abnormal gait, decreased activity tolerance, decreased balance, decreased coordination, difficulty walking, decreased ROM, decreased strength, impaired flexibility, and postural dysfunction.   ACTIVITY LIMITATIONS: bending, transfers, bed mobility, and locomotion level  PARTICIPATION LIMITATIONS: community activity  PERSONAL FACTORS: Age, Behavior pattern, Past/current experiences, Time since onset of injury/illness/exacerbation, and 1-2 comorbidities: HLD, BPH are also affecting patient's functional outcome.   REHAB POTENTIAL: Good  CLINICAL DECISION MAKING: Evolving/moderate complexity  EVALUATION COMPLEXITY: Moderate  PLAN:  PT FREQUENCY: 2x/week  PT DURATION: 6 weeks - due to delay in scheduling, anticipate just 8 PT visits for 4 weeks  Plus an additional 1x week for 4 weeks for re-cert   PLANNED INTERVENTIONS: [id number]- PT Re-evaluation, 97110-Therapeutic exercises, 97530- Therapeutic activity, [id number]- Neuromuscular re-education, 97535- Self Care, [id number]- Manual therapy, [phone]- Gait training, Patient/Family education, Balance training, and Stair training  PLAN FOR NEXT SESSION:  D/C   Sheffield LOISE Senate, PT, DPT 12/24/2023, 10:56 AM

## 2024-01-03 ENCOUNTER — Other Ambulatory Visit: Payer: Self-pay | Admitting: Cardiovascular Disease

## 2024-01-13 ENCOUNTER — Ambulatory Visit: Admitting: Occupational Therapy

## 2024-02-03 ENCOUNTER — Encounter: Payer: Self-pay | Admitting: Cardiovascular Disease

## 2024-02-03 ENCOUNTER — Ambulatory Visit: Attending: Cardiovascular Disease | Admitting: Cardiovascular Disease

## 2024-02-03 VITALS — BP 120/60 | HR 72 | Ht 71.0 in | Wt 163.4 lb

## 2024-02-03 DIAGNOSIS — I951 Orthostatic hypotension: Secondary | ICD-10-CM | POA: Diagnosis not present

## 2024-02-03 NOTE — Progress Notes (Signed)
 "    Cardiology Office Note   Date:  02/03/2024   ID:  Carl Nichols, DOB 02-28-1940, MRN [id number]  PCP:  [name] Santos, MD  Cardiologist:   Deatrice Cage, MD   Chief Complaint  Patient presents with   Follow-up    6 wk f/u discuss echo. Meds reviewed verbally with pt.      History of Present Illness: Carl Nichols is a 84 y.o. male who is here today for a follow-up visit for orthostatic hypotension.   He has known history of hyperlipidemia, BPH and chronic kidney disease. He was recently diagnosed with Parkinson's.  He was seen for dizziness and was found to have significant orthostatic hypotension.   He was started on midodrine  with significant improvement in symptoms.  Echocardiogram showed normal LV systolic function with mild mitral regurgitation.  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     alfuzosin (UROXATRAL) 10 MG 24 hr tablet TAKE 1 TABLET BY MOUTH TWICE DAILY 180 tablet 3   carbidopa -levodopa  (SINEMET  IR) 25-100 MG tablet Take 1 tablet by mouth 3 (three) times daily. 7am/11am/4pm 270 tablet 0   carboxymethylcellul-glycerin (OPTIVE) 0.5-0.9 % ophthalmic solution Place 1 drop into both eyes as needed.     ezetimibe (ZETIA) 10 MG tablet Take 10 mg by mouth daily.     finasteride (PROSCAR) 5 MG tablet TAKE 1 TABLET BY MOUTH EVERY [place] 90  tablet 3   loratadine (CLARITIN) 10 MG tablet Take 10 mg by mouth daily as needed.     midodrine  (PROAMATINE ) 2.5 MG tablet Take 1 tablet by mouth 3 times daily with meals. 90 tablet 0   Multiple Vitamin (MULTI VITAMIN DAILY PO) daily.     Probiotic Product (PROBIOTIC PO) daily.     psyllium (METAMUCIL) 58.6 % packet Take 1 packet by mouth daily.     No current facility-administered medications for this visit.    Allergies:   Rosuvastatin and Penicillin g    Social History:  The patient  reports that he has never smoked. He has never used smokeless tobacco. He reports that he does not currently use alcohol. He reports that he does not use drugs.   Family History:  The patient's family history includes Cancer in his mother; Healthy in his son and son.    ROS:  Please see the history of present illness.   Otherwise, review of systems are positive for none.   All other systems are reviewed and negative.    PHYSICAL EXAM: VS:  BP 120/60 (BP Location: Left Arm, Patient Position: Sitting, Cuff Size: Normal)   Pulse 72   Ht 5' 11 (1.803 m)   Wt 163 lb  6 oz (74.1 kg)   SpO2 96%   BMI 22.79 kg/m  , BMI Body mass index is 22.79 kg/m. GEN: Well nourished, well developed, in no acute distress  HEENT: normal  Neck: no JVD, carotid bruits, or masses Cardiac: RRR; no murmurs, rubs, or gallops,no edema  Respiratory:  clear to auscultation bilaterally, normal work of breathing GI: soft, nontender, nondistended, + BS MS: no deformity or atrophy  Skin: warm and dry, no rash Neuro:  Strength and sensation are intact Psych: euthymic mood, full affect   EKG:  EKG is not ordered today.        Recent Labs: No results found for requested labs within last 365 days.    Lipid Panel No results found for: CHOL, TRIG, HDL, CHOLHDL, VLDL, LDLCALC, LDLDIRECT    Wt Readings from Last 3 Encounters:  02/03/24 163 lb 6 oz (74.1 kg)  11/09/23 159 lb (72.1 kg)  10/20/23 157 lb 6 oz  (71.4 kg)          10/20/2023    2:18 PM  PAD Screen  Previous PAD dx? No  Previous surgical procedure? No  Pain with walking? No  Feet/toe relief with dangling? No  Painful, non-healing ulcers? No  Extremities discolored? No      ASSESSMENT AND PLAN:  1.  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  He does use knee-high support stockings.  If things worsen, will add an abdominal binder.    2.  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  No need for surveillance.  I doubt that this will progress to being clinically significant.    Disposition:   FU in 6 months.  Signed,  Deatrice Cage, MD  02/03/2024 9:58 AM    [facility] "

## 2024-02-03 NOTE — Patient Instructions (Signed)
 Medication Instructions:   Your physician recommends that you continue on your current medications as directed. Please refer to the Current Medication list given to you today.  *If you need a refill on your cardiac medications before your next appointment, please call your pharmacy*  Lab Work:  NONE  If you have labs (blood work) drawn today and your tests are completely normal, you will receive your results only by: MyChart Message (if you have MyChart) OR A paper copy in the mail If you have any lab test that is abnormal or we need to change your treatment, we will call you to review the results.  Testing/Procedures:  NONE  Follow-Up: At [facility], you and your health needs are our priority.  As part of our continuing mission to provide you with exceptional heart care, our providers are all part of one team.  This team includes your primary Cardiologist (physician) and Advanced Practice Providers or APPs (Physician Assistants and Nurse Practitioners) who all work together to provide you with the care you need, when you need it.  Your next appointment:   6 month(s)  Provider:   You may see Deatrice Cage, MD or one of the following Advanced Practice Providers on your designated Care Team:   Lonni Meager, NP Lesley Maffucci, PA-C Bernardino Bring, PA-C Cadence Chewton, PA-C Tylene Lunch, NP Barnie Hila, NP

## 2024-02-10 ENCOUNTER — Ambulatory Visit: Admitting: Neurology

## 2024-02-12 ENCOUNTER — Other Ambulatory Visit: Payer: Self-pay

## 2024-02-15 ENCOUNTER — Other Ambulatory Visit: Payer: Self-pay | Admitting: Cardiovascular Disease

## 2024-02-16 ENCOUNTER — Other Ambulatory Visit: Payer: Self-pay

## 2024-02-16 ENCOUNTER — Telehealth: Payer: Self-pay | Admitting: Neurology

## 2024-02-16 ENCOUNTER — Encounter: Payer: Self-pay | Admitting: Cardiovascular Disease

## 2024-02-16 ENCOUNTER — Encounter: Payer: Self-pay | Admitting: Neurology

## 2024-02-16 DIAGNOSIS — G20A1 Parkinson's disease without dyskinesia, without mention of fluctuations: Secondary | ICD-10-CM

## 2024-02-16 MED ORDER — MIDODRINE HCL 2.5 MG PO TABS: 2.5000 mg | ORAL_TABLET | Freq: Three times a day (TID) | ORAL | 3 refills | Status: AC

## 2024-02-16 MED ORDER — CARBIDOPA-LEVODOPA 25-100 MG PO TABS: 1.0000 | ORAL_TABLET | Freq: Three times a day (TID) | ORAL | 0 refills | Status: AC

## 2024-05-10 ENCOUNTER — Ambulatory Visit: Admitting: Neurology

## 2024-06-21 ENCOUNTER — Ambulatory Visit: Admitting: Physical Therapy

## 2024-06-21 ENCOUNTER — Ambulatory Visit: Admitting: Occupational Therapy

## 2024-07-05 ENCOUNTER — Ambulatory Visit: Admitting: Occupational Therapy

## 2024-07-05 ENCOUNTER — Ambulatory Visit: Admitting: Physical Therapy
